# Patient Record
Sex: Female | Born: 1990
Health system: Southern US, Community
[De-identification: ages and names within clinical notes are randomized; demographics above are authoritative.]

## PROBLEM LIST (undated history)

## (undated) DIAGNOSIS — K219 Gastro-esophageal reflux disease without esophagitis: Secondary | ICD-10-CM

## (undated) DIAGNOSIS — L309 Dermatitis, unspecified: Secondary | ICD-10-CM

## (undated) DIAGNOSIS — J45909 Unspecified asthma, uncomplicated: Secondary | ICD-10-CM

## (undated) DIAGNOSIS — T7840XA Allergy, unspecified, initial encounter: Secondary | ICD-10-CM

## (undated) DIAGNOSIS — F419 Anxiety disorder, unspecified: Secondary | ICD-10-CM

## (undated) DIAGNOSIS — F32A Depression, unspecified: Secondary | ICD-10-CM

## (undated) DIAGNOSIS — M1990 Unspecified osteoarthritis, unspecified site: Secondary | ICD-10-CM

## (undated) HISTORY — DX: Anxiety disorder, unspecified: F41.9

## (undated) HISTORY — DX: Allergy, unspecified, initial encounter: T78.40XA

## (undated) HISTORY — DX: Gastro-esophageal reflux disease without esophagitis: K21.9

## (undated) HISTORY — DX: Dermatitis, unspecified: L30.9

## (undated) HISTORY — DX: Unspecified osteoarthritis, unspecified site: M19.90

## (undated) HISTORY — DX: Unspecified asthma, uncomplicated: J45.909

## (undated) HISTORY — DX: Depression, unspecified: F32.A

---

## 2018-01-16 DIAGNOSIS — F419 Anxiety disorder, unspecified: Secondary | ICD-10-CM | POA: Diagnosis not present

## 2018-01-30 DIAGNOSIS — F419 Anxiety disorder, unspecified: Secondary | ICD-10-CM | POA: Diagnosis not present

## 2018-02-06 DIAGNOSIS — F419 Anxiety disorder, unspecified: Secondary | ICD-10-CM | POA: Diagnosis not present

## 2018-02-13 DIAGNOSIS — F419 Anxiety disorder, unspecified: Secondary | ICD-10-CM | POA: Diagnosis not present

## 2018-02-20 DIAGNOSIS — F419 Anxiety disorder, unspecified: Secondary | ICD-10-CM | POA: Diagnosis not present

## 2018-02-27 DIAGNOSIS — F419 Anxiety disorder, unspecified: Secondary | ICD-10-CM | POA: Diagnosis not present

## 2018-03-20 DIAGNOSIS — F419 Anxiety disorder, unspecified: Secondary | ICD-10-CM | POA: Diagnosis not present

## 2018-04-10 DIAGNOSIS — F419 Anxiety disorder, unspecified: Secondary | ICD-10-CM | POA: Diagnosis not present

## 2018-04-24 DIAGNOSIS — F419 Anxiety disorder, unspecified: Secondary | ICD-10-CM | POA: Diagnosis not present

## 2018-05-15 DIAGNOSIS — Z01419 Encounter for gynecological examination (general) (routine) without abnormal findings: Secondary | ICD-10-CM | POA: Diagnosis not present

## 2018-05-15 DIAGNOSIS — F419 Anxiety disorder, unspecified: Secondary | ICD-10-CM | POA: Diagnosis not present

## 2018-05-15 DIAGNOSIS — Z681 Body mass index (BMI) 19 or less, adult: Secondary | ICD-10-CM | POA: Diagnosis not present

## 2018-05-29 DIAGNOSIS — F419 Anxiety disorder, unspecified: Secondary | ICD-10-CM | POA: Diagnosis not present

## 2018-11-09 DIAGNOSIS — L700 Acne vulgaris: Secondary | ICD-10-CM | POA: Diagnosis not present

## 2018-11-09 DIAGNOSIS — D225 Melanocytic nevi of trunk: Secondary | ICD-10-CM | POA: Diagnosis not present

## 2019-10-18 ENCOUNTER — Encounter: Payer: Self-pay | Admitting: Adult Health

## 2019-10-18 ENCOUNTER — Other Ambulatory Visit: Payer: Self-pay

## 2019-10-18 ENCOUNTER — Ambulatory Visit (INDEPENDENT_AMBULATORY_CARE_PROVIDER_SITE_OTHER): Payer: BC Managed Care – PPO | Admitting: Adult Health

## 2019-10-18 VITALS — BP 122/70 | HR 71 | Temp 97.1°F | Resp 16 | Ht 67.5 in | Wt 135.0 lb

## 2019-10-18 DIAGNOSIS — Z1329 Encounter for screening for other suspected endocrine disorder: Secondary | ICD-10-CM

## 2019-10-18 DIAGNOSIS — M2550 Pain in unspecified joint: Secondary | ICD-10-CM | POA: Insufficient documentation

## 2019-10-18 DIAGNOSIS — Z13220 Encounter for screening for lipoid disorders: Secondary | ICD-10-CM | POA: Diagnosis not present

## 2019-10-18 DIAGNOSIS — R5383 Other fatigue: Secondary | ICD-10-CM | POA: Insufficient documentation

## 2019-10-18 DIAGNOSIS — Z0000 Encounter for general adult medical examination without abnormal findings: Secondary | ICD-10-CM

## 2019-10-18 DIAGNOSIS — M2540 Effusion, unspecified joint: Secondary | ICD-10-CM

## 2019-10-18 DIAGNOSIS — R768 Other specified abnormal immunological findings in serum: Secondary | ICD-10-CM

## 2019-10-18 DIAGNOSIS — E559 Vitamin D deficiency, unspecified: Secondary | ICD-10-CM

## 2019-10-18 DIAGNOSIS — Z3009 Encounter for other general counseling and advice on contraception: Secondary | ICD-10-CM | POA: Insufficient documentation

## 2019-10-18 NOTE — Patient Instructions (Addendum)
Breast Self-Awareness Breast self-awareness is knowing how your breasts look and feel. Doing breast self-awareness is important. It allows you to catch a breast problem early while it is still small and can be treated. All women should do breast self-awareness, including women who have had breast implants. Tell your doctor if you notice a change in your breasts. What you need:  A mirror.  A well-lit room. How to do a breast self-exam A breast self-exam is one way to learn what is normal for your breasts and to check for changes. To do a breast self-exam: Look for changes  1. Take off all the clothes above your waist. 2. Stand in front of a mirror in a room with good lighting. 3. Put your hands on your hips. 4. Push your hands down. 5. Look at your breasts and nipples in the mirror to see if one breast or nipple looks different from the other. Check to see if: ? The shape of one breast is different. ? The size of one breast is different. ? There are wrinkles, dips, and bumps in one breast and not the other. 6. Look at each breast for changes in the skin, such as: ? Redness. ? Scaly areas. 7. Look for changes in your nipples, such as: ? Liquid around the nipples. ? Bleeding. ? Dimpling. ? Redness. ? A change in where the nipples are. Feel for changes  1. Lie on your back on the floor. 2. Feel each breast. To do this, follow these steps: ? Pick a breast to feel. ? Put the arm closest to that breast above your head. ? Use your other arm to feel the nipple area of your breast. Feel the area with the pads of your three middle fingers by making small circles with your fingers. For the first circle, press lightly. For the second circle, press harder. For the third circle, press even harder. ? Keep making circles with your fingers at the different pressures as you move down your breast. Stop when you feel your ribs. ? Move your fingers a little toward the center of your body. ? Start  making circles with your fingers again, this time going up until you reach your collarbone. ? Keep making up-and-down circles until you reach your armpit. Remember to keep using the three pressures. ? Feel the other breast in the same way. 3. Sit or stand in the tub or shower. 4. With soapy water on your skin, feel each breast the same way you did in step 2 when you were lying on the floor. Write down what you find Writing down what you find can help you remember what to tell your doctor. Write down:  What is normal for each breast.  Any changes you find in each breast, including: ? The kind of changes you find. ? Whether you have pain. ? Size and location of any lumps.  When you last had your menstrual period. General tips  Check your breasts every month.  If you are breastfeeding, the best time to check your breasts is after you feed your baby or after you use a breast pump.  If you get menstrual periods, the best time to check your breasts is 5-7 days after your menstrual period is over.  With time, you will become comfortable with the self-exam, and you will begin to know if there are changes in your breasts. Contact a doctor if you:  See a change in the shape or size of your breasts or  nipples.  See a change in the skin of your breast or nipples, such as red or scaly skin.  Have fluid coming from your nipples that is not normal.  Find a lump or thick area that was not there before.  Have pain in your breasts.  Have any concerns about your breast health. Summary  Breast self-awareness includes looking for changes in your breasts, as well as feeling for changes within your breasts.  Breast self-awareness should be done in front of a mirror in a well-lit room.  You should check your breasts every month. If you get menstrual periods, the best time to check your breasts is 5-7 days after your menstrual period is over.  Let your doctor know of any changes you see in your  breasts, including changes in size, changes on the skin, pain or tenderness, or fluid from your nipples that is not normal. This information is not intended to replace advice given to you by your health care provider. Make sure you discuss any questions you have with your health care provider. Document Revised: 04/07/2018 Document Reviewed: 04/07/2018 Elsevier Patient Education  [place] Maintenance, Female Adopting a healthy lifestyle and getting preventive care are important in promoting health and wellness. Ask your health care provider about:  The right schedule for you to have regular tests and exams.  Things you can do on your own to prevent diseases and keep yourself healthy. What should I know about diet, weight, and exercise? Eat a healthy diet   Eat a diet that includes plenty of vegetables, fruits, low-fat dairy products, and lean protein.  Do not eat a lot of foods that are high in solid fats, added sugars, or sodium. Maintain a healthy weight Body mass index (BMI) is used to identify weight problems. It estimates body fat based on height and weight. Your health care provider can help determine your BMI and help you achieve or maintain a healthy weight. Get regular exercise Get regular exercise. This is one of the most important things you can do for your health. Most adults should:  Exercise for at least 150 minutes each week. The exercise should increase your heart rate and make you sweat (moderate-intensity exercise).  Do strengthening exercises at least twice a week. This is in addition to the moderate-intensity exercise.  Spend less time sitting. Even light physical activity can be beneficial. Watch cholesterol and blood lipids Have your blood tested for lipids and cholesterol at 28 years of age, then have this test every 5 years. Have your cholesterol levels checked more often if:  Your lipid or cholesterol levels are high.  You are older than 28  years of age.  You are at high risk for heart disease. What should I know about cancer screening? Depending on your health history and family history, you may need to have cancer screening at various ages. This may include screening for:  Breast cancer.  Cervical cancer.  Colorectal cancer.  Skin cancer.  Lung cancer. What should I know about heart disease, diabetes, and high blood pressure? Blood pressure and heart disease  High blood pressure causes heart disease and increases the risk of stroke. This is more likely to develop in people who have high blood pressure readings, are of African descent, or are overweight.  Have your blood pressure checked: ? Every 3-5 years if you are 90-51 years of age. ? Every year if you are 73 years old or older. Diabetes Have regular diabetes screenings. This  checks your fasting blood sugar level. Have the screening done:  Once every three years after age 47 if you are at a normal weight and have a low risk for diabetes.  More often and at a younger age if you are overweight or have a high risk for diabetes. What should I know about preventing infection? Hepatitis B If you have a higher risk for hepatitis B, you should be screened for this virus. Talk with your health care provider to find out if you are at risk for hepatitis B infection. Hepatitis C Testing is recommended for:  Everyone born from [birth year] through 1964.  Anyone with known risk factors for hepatitis C. Sexually transmitted infections (STIs)  Get screened for STIs, including gonorrhea and chlamydia, if: ? You are sexually active and are younger than 28 years of age. ? You are older than 28 years of age and your health care provider tells you that you are at risk for this type of infection. ? Your sexual activity has changed since you were last screened, and you are at increased risk for chlamydia or gonorrhea. Ask your health care provider if you are at risk.  Ask your health  care provider about whether you are at high risk for HIV. Your health care provider may recommend a prescription medicine to help prevent HIV infection. If you choose to take medicine to prevent HIV, you should first get tested for HIV. You should then be tested every 3 months for as long as you are taking the medicine. Pregnancy  If you are about to stop having your period (premenopausal) and you may become pregnant, seek counseling before you get pregnant.  Take 400 to 800 micrograms (mcg) of folic acid every day if you become pregnant.  Ask for birth control (contraception) if you want to prevent pregnancy. Osteoporosis and menopause Osteoporosis is a disease in which the bones lose minerals and strength with aging. This can result in bone fractures. If you are 62 years old or older, or if you are at risk for osteoporosis and fractures, ask your health care provider if you should:  Be screened for bone loss.  Take a calcium or vitamin D supplement to lower your risk of fractures.  Be given hormone replacement therapy (HRT) to treat symptoms of menopause. Follow these instructions at home: Lifestyle  Do not use any products that contain nicotine or tobacco, such as cigarettes, e-cigarettes, and chewing tobacco. If you need help quitting, ask your health care provider.  Do not use street drugs.  Do not share needles.  Ask your health care provider for help if you need support or information about quitting drugs. Alcohol use  Do not drink alcohol if: ? Your health care provider tells you not to drink. ? You are pregnant, may be pregnant, or are planning to become pregnant.  If you drink alcohol: ? Limit how much you use to 0-1 drink a day. ? Limit intake if you are breastfeeding.  Be aware of how much alcohol is in your drink. In the [place] one drink equals one 12 oz bottle of beer (355 mL), one 5 oz glass of wine (148 mL), or one 1 oz glass of hard liquor (44 mL). General  instructions  Schedule regular health, dental, and eye exams.  Stay current with your vaccines.  Tell your health care provider if: ? You often feel depressed. ? You have ever been abused or do not feel safe at home. Summary  Adopting a  healthy lifestyle and getting preventive care are important in promoting health and wellness.  Follow your health care provider's instructions about healthy diet, exercising, and getting tested or screened for diseases.  Follow your health care provider's instructions on monitoring your cholesterol and blood pressure. This information is not intended to replace advice given to you by your health care provider. Make sure you discuss any questions you have with your health care provider. Document Revised: 08/12/2018 Document Reviewed: 08/12/2018 Elsevier Patient Education  2020 [employer].

## 2019-10-18 NOTE — Progress Notes (Signed)
Patient: Brandi Clayton, Female    DOB: 1990-10-15, 29 y.o.   MRN: [id number] Visit Date: 10/18/2019  Today's Provider: Marcille Buffy, FNP   Chief Complaint  Patient presents with  . New Patient (Initial Visit)   Subjective:    New Patient Brandi Clayton is a 29 y.o. female who presents today for health maintenance and establish care as a new patient, she is a former patient of [facility]. She feels fairly well, patient states that she would like to address joint pain in her hands and toes that has been present since Dec. 2020. She reports she is actively exercising . She reports she is sleeping fairly well.  Patient repots last pap examination was in 2019 through [facility] and was normal.  She has been taking Tumeric the past 1- 2 weeks with " maybe some improvement" .  She reports she always haas joint pain in her right hand, she lightly scraped her middle finger of her right hand in December 2020, he knuckle was mildly swollen at that time.  Feels like all of her knuckles are swollen in her left hand joint swelling.   She reports she occasionally has joint swelling in bilateral feet.  No other known injury.  Denies any x ray.  She occasionally takes NSAIDs for PMS and she notices decrease in pain and swelling.  She stopped  Painting her nails thinking this was it.   Maternal grandmother and paternal grandmother with rheumatoid arthritis.   Patient  denies any fever, chills, rash, chest pain, shortness of breath, nausea, vomiting, or diarrhea.  Records request obtained.   She denies any tick bites.   Patient's last menstrual period was 10/12/2019 (exact date).  -----------------------------------------------------------------   Review of Systems  Constitutional: Positive for fatigue.  HENT: Positive for postnasal drip and rhinorrhea.   Gastrointestinal: Positive for abdominal distention, abdominal pain and diarrhea.  Musculoskeletal: Positive for  arthralgias, back pain, joint swelling and myalgias.  Allergic/Immunologic: Positive for environmental allergies.  Psychiatric/Behavioral: Positive for agitation, decreased concentration and sleep disturbance. The patient is nervous/anxious.   All other systems reviewed and are negative.   Social History She   Social History   Socioeconomic History  . Marital status: Married    Spouse name: Not on file  . Number of children: Not on file  . Years of education: Not on file  . Highest education level: Not on file  Occupational History  . Not on file  Tobacco Use  . Smoking status: Not on file  Substance and Sexual Activity  . Alcohol use: Not on file  . Drug use: Not on file  . Sexual activity: Not on file  Other Topics Concern  . Not on file  Social History Narrative  . Not on file   Social Determinants of Health   Financial Resource Strain:   . Difficulty of Paying Living Expenses: Not on file  Food Insecurity:   . Worried About Charity fundraiser in the Last Year: Not on file  . Ran Out of Food in the Last Year: Not on file  Transportation Needs:   . Lack of Transportation (Medical): Not on file  . Lack of Transportation (Non-Medical): Not on file  Physical Activity:   . Days of Exercise per Week: Not on file  . Minutes of Exercise per Session: Not on file  Stress:   . Feeling of Stress : Not on file  Social Connections:   . Frequency  of Communication with Friends and Family: Not on file  . Frequency of Social Gatherings with Friends and Family: Not on file  . Attends Religious Services: Not on file  . Active Member of Clubs or Organizations: Not on file  . Attends Archivist Meetings: Not on file  . Marital Status: Not on file     Family History  No family status information on file.   Her family history is not on file.     Not on File  Previous Medications   No medications on file    Patient Care Team: Doreen Beam, FNP as PCP -  General (Family Medicine)      Objective:  Blood pressure 122/70, pulse 71, temperature (!) 97.1 F (36.2 C), temperature source Oral, resp. rate 16, height 5' 7.5" (1.715 m), weight 135 lb (61.2 kg), last menstrual period 10/12/2019, SpO2 99 %.   Physical Exam Vitals reviewed.  Constitutional:      General: She is not in acute distress.    Appearance: She is well-developed. She is not diaphoretic.     Interventions: She is not intubated. HENT:     Head: Normocephalic and atraumatic.     Right Ear: External ear normal.     Left Ear: External ear normal.     Nose: Nose normal.     Mouth/Throat:     Pharynx: No oropharyngeal exudate.  Eyes:     General: Lids are normal. No scleral icterus.       Right eye: No discharge.        Left eye: No discharge.     Conjunctiva/sclera: Conjunctivae normal.     Right eye: Right conjunctiva is not injected. No exudate or hemorrhage.    Left eye: Left conjunctiva is not injected. No exudate or hemorrhage.    Pupils: Pupils are equal, round, and reactive to light.  Neck:     Thyroid: No thyroid mass or thyromegaly.     Vascular: Normal carotid pulses. No carotid bruit, hepatojugular reflux or JVD.     Trachea: Trachea and phonation normal. No tracheal tenderness or tracheal deviation.     Meningeal: Brudzinski's sign and Kernig's sign absent.  Cardiovascular:     Rate and Rhythm: Normal rate and regular rhythm.     Pulses: Normal pulses.          Radial pulses are 2+ on the right side and 2+ on the left side.       Dorsalis pedis pulses are 2+ on the right side and 2+ on the left side.       Posterior tibial pulses are 2+ on the right side and 2+ on the left side.     Heart sounds: Normal heart sounds, S1 normal and S2 normal. Heart sounds not distant. No murmur. No friction rub. No gallop.   Pulmonary:     Effort: Pulmonary effort is normal. No tachypnea, bradypnea, accessory muscle usage or respiratory distress. She is not intubated.      Breath sounds: Normal breath sounds. No stridor. No wheezing or rales.  Chest:     Chest wall: No tenderness.  Abdominal:     General: Bowel sounds are normal. There is no distension or abdominal bruit.     Palpations: Abdomen is soft. There is no shifting dullness, fluid wave, hepatomegaly, splenomegaly, mass or pulsatile mass.     Tenderness: There is no abdominal tenderness. There is no guarding or rebound.     Hernia: No hernia  is present.  Genitourinary:    Comments: deferred - she will send records of last PAP smear  Musculoskeletal:        General: No deformity.     Right upper arm: Normal.     Left upper arm: Normal.     Right elbow: Normal.     Left elbow: Normal.     Right forearm: Normal.     Left forearm: Normal.     Right wrist: Normal.     Left wrist: Normal.     Right hand: Swelling (scant joiny swelling of middle finger at distal and proximal interphylangeal joint also noted with mild tenderness ring finger of same hand and same joints as middle finger. No rash . No erythema. ), tenderness and bony tenderness present. No deformity or lacerations. Normal range of motion. Normal strength. Normal sensation. There is no disruption of two-point discrimination. Normal capillary refill. Normal pulse.     Left hand: No swelling, deformity, lacerations, tenderness or bony tenderness. Normal range of motion. Normal strength. Normal sensation. There is no disruption of two-point discrimination. Normal capillary refill. Normal pulse.     Cervical back: Full passive range of motion without pain, normal range of motion and neck supple. No edema, erythema or rigidity. No spinous process tenderness or muscular tenderness. Normal range of motion.     Right hip: Normal.     Left hip: Normal.     Right upper leg: Normal.     Left upper leg: Normal.     Right knee: Normal.     Left knee: Normal.     Right lower leg: Normal.     Left lower leg: Normal.     Right ankle: Normal.     Left  ankle: Normal.  Lymphadenopathy:     Head:     Right side of head: No submental, submandibular, tonsillar, preauricular, posterior auricular or occipital adenopathy.     Left side of head: No submental, submandibular, tonsillar, preauricular, posterior auricular or occipital adenopathy.     Cervical: No cervical adenopathy.     Right cervical: No superficial, deep or posterior cervical adenopathy.    Left cervical: No superficial, deep or posterior cervical adenopathy.     Upper Body:     Right upper body: No supraclavicular or pectoral adenopathy.     Left upper body: No supraclavicular or pectoral adenopathy.  Skin:    General: Skin is warm and dry.     Capillary Refill: Capillary refill takes less than 2 seconds.     Coloration: Skin is not pale.     Findings: No abrasion, bruising, burn, ecchymosis, erythema, lesion, petechiae or rash.     Nails: There is no clubbing.     Comments: Nails have some yellow discoloration of all nails bilaterally hands towards distal nail, she has new growth that appears normal- she reports this was from nail stain.   Neurological:     Mental Status: She is alert and oriented to person, place, and time.     GCS: GCS eye subscore is 4. GCS verbal subscore is 5. GCS motor subscore is 6.     Cranial Nerves: No cranial nerve deficit.     Sensory: No sensory deficit.     Motor: No weakness, tremor, atrophy, abnormal muscle tone or seizure activity.     Coordination: Coordination normal.     Gait: Gait normal.     Deep Tendon Reflexes: Reflexes are normal and symmetric. Reflexes normal. Babinski sign  absent on the right side. Babinski sign absent on the left side.     Reflex Scores:      Tricep reflexes are 2+ on the right side and 2+ on the left side.      Bicep reflexes are 2+ on the right side and 2+ on the left side.      Brachioradialis reflexes are 2+ on the right side and 2+ on the left side.      Patellar reflexes are 2+ on the right side and 2+ on  the left side.      Achilles reflexes are 2+ on the right side and 2+ on the left side. Psychiatric:        Mood and Affect: Mood normal.        Speech: Speech normal.        Behavior: Behavior normal.        Thought Content: Thought content normal.        Judgment: Judgment normal.      Depression Screen Depression screen [facility] 2/9 10/18/2019  Decreased Interest 0  Down, Depressed, Hopeless 0  PHQ - 2 Score 0  Altered sleeping 1  Tired, decreased energy 1  Change in appetite 1  Feeling bad or failure about yourself  0  Trouble concentrating 1  Moving slowly or fidgety/restless 0  Suicidal thoughts 0  PHQ-9 Score 4  Difficult doing work/chores Not difficult at all      Assessment & Plan:     Routine Health Maintenance and Physical Exam  Exercise Activities and Dietary recommendations Goals   None      There is no immunization history on file for this patient.  Health Maintenance  Topic Date Due  . HIV Screening  01/27/2007  . TETANUS/TDAP  01/27/2011  . PAP-Cervical Cytology Screening  01/26/2013  . PAP SMEAR-Modifier  01/26/2013  . INFLUENZA VACCINE  04/03/2019     Discussed health benefits of physical activity, and encouraged her to engage in regular exercise appropriate for her age and condition.   yearly dental and eye exams recommended.  She needs PAP records sent, believes  she is due. Will schedule here or let us know if wants gynecology.     1. Routine health maintenance Healthy lifestyle, diet.  - POCT urinalysis dipstick  2. Joint swelling Will follow closely, x  Ray today.   - CBC with Differential/Platelet - Comprehensive Metabolic Panel (CMET) - TSH - Rheumatoid factor - ANA Direct w/Reflex if Positive - DG Hand Complete Right; Future - B. burgdorfi antibodies - RPR w/reflex to TrepSure - Uric acid  3. Fatigue, unspecified type Rule our anemia.Healthy diet and lifestyle recommendations  - Rheumatoid factor - B. burgdorfi  antibodies - RPR w/reflex to TrepSure  4. Screening for lipoid disorders Healthy diet and lifestyle.  - Lipid Panel w/o Chol/HDL Ratio  5. Screening for thyroid disorder Screening  - TSH  6. Vitamin D deficiency Will check level.  - VITAMIN D 25 Hydroxy (Vit-D Deficiency, Fractures)  7. Arthralgia, unspecified joint Right hand left  Than right, has mild knee's bilaterally with pain at times since highschool- she was a runner.  - Rheumatoid factor - ANA Direct w/Reflex if Positive - DG Hand Complete Right; Future - B. burgdorfi antibodies Follow up on joint pain.   Return in about 1 month (around 11/15/2019), or if symptoms worsen or fail to improve, for at any time for any worsening symptoms, Go to Emergency room/ urgent care if worse.  Advised  patient call the office or your primary care doctor for an appointment if no improvement within 72 hours or if any symptoms change or worsen at any time  Advised ER or urgent Care if after hours or on weekend. Call 911 for emergency symptoms at any time.Patinet verbalized understanding of all instructions given/reviewed and treatment plan and has no further questions or concerns at this time.    The entirety of the information documented in the History of Present Illness, Review of Systems and Physical Exam were personally obtained by me. Portions of this information were initially documented by the  Certified Medical Assistant whose name is documented in [place] and reviewed by me for thoroughness and accuracy.  I have personally performed the exam and reviewed the chart and it is accurate to the best of my knowledge.  Haematologist has been used and any errors in dictation or transcription are unintentional.  Kelby Aline. [facility]  --------------------------------------------------------------------

## 2019-10-19 ENCOUNTER — Ambulatory Visit
Admission: RE | Admit: 2019-10-19 | Discharge: 2019-10-19 | Disposition: A | Discharge status: Home / Self Care | Payer: BC Managed Care – PPO | Attending: Adult Health | Admitting: Adult Health

## 2019-10-19 ENCOUNTER — Other Ambulatory Visit: Payer: Self-pay

## 2019-10-19 ENCOUNTER — Ambulatory Visit
Admission: RE | Admit: 2019-10-19 | Discharge: 2019-10-19 | Disposition: A | Discharge status: Home / Self Care | Payer: BC Managed Care – PPO | Source: Ambulatory Visit | Attending: Adult Health | Admitting: Adult Health

## 2019-10-19 ENCOUNTER — Other Ambulatory Visit: Payer: Self-pay | Admitting: Adult Health

## 2019-10-19 DIAGNOSIS — E559 Vitamin D deficiency, unspecified: Secondary | ICD-10-CM | POA: Diagnosis not present

## 2019-10-19 DIAGNOSIS — R5383 Other fatigue: Secondary | ICD-10-CM | POA: Diagnosis not present

## 2019-10-19 DIAGNOSIS — M2540 Effusion, unspecified joint: Secondary | ICD-10-CM | POA: Insufficient documentation

## 2019-10-19 DIAGNOSIS — M79641 Pain in right hand: Secondary | ICD-10-CM | POA: Diagnosis not present

## 2019-10-19 DIAGNOSIS — M7989 Other specified soft tissue disorders: Secondary | ICD-10-CM | POA: Diagnosis not present

## 2019-10-19 DIAGNOSIS — Z13220 Encounter for screening for lipoid disorders: Secondary | ICD-10-CM | POA: Diagnosis not present

## 2019-10-19 DIAGNOSIS — M2550 Pain in unspecified joint: Secondary | ICD-10-CM | POA: Insufficient documentation

## 2019-10-19 DIAGNOSIS — Z1329 Encounter for screening for other suspected endocrine disorder: Secondary | ICD-10-CM | POA: Diagnosis not present

## 2019-10-19 NOTE — Progress Notes (Signed)
X ray is normal of right hand. Will await labs. Keep follow up as discussed at office visit.

## 2019-10-21 LAB — COMPREHENSIVE METABOLIC PANEL
ALT: 17 IU/L (ref 0–32)
AST: 24 IU/L (ref 0–40)
Albumin/Globulin Ratio: 1.8 (ref 1.2–2.2)
Albumin: 4.6 g/dL (ref 3.9–5.0)
Alkaline Phosphatase: 61 IU/L (ref 39–117)
BUN/Creatinine Ratio: 19 (ref 9–23)
BUN: 14 mg/dL (ref 6–20)
Bilirubin Total: 0.3 mg/dL (ref 0.0–1.2)
CO2: 22 mmol/L (ref 20–29)
Calcium: 9.5 mg/dL (ref 8.7–10.2)
Chloride: 101 mmol/L (ref 96–106)
Creatinine, Ser: 0.72 mg/dL (ref 0.57–1.00)
GFR calc Af Amer: 133 mL/min/{1.73_m2} (ref >59)
GFR calc non Af Amer: 115 mL/min/{1.73_m2} (ref >59)
Globulin, Total: 2.6 g/dL (ref 1.5–4.5)
Glucose: 83 mg/dL (ref 65–99)
Potassium: 4.1 mmol/L (ref 3.5–5.2)
Sodium: 138 mmol/L (ref 134–144)
Total Protein: 7.2 g/dL (ref 6.0–8.5)

## 2019-10-21 LAB — LIPID PANEL W/O CHOL/HDL RATIO
Cholesterol, Total: 177 mg/dL (ref 100–199)
HDL: 61 mg/dL (ref >39)
LDL Chol Calc (NIH): 105 mg/dL — ABNORMAL HIGH (ref 0–99)
Triglycerides: 54 mg/dL (ref 0–149)
VLDL Cholesterol Cal: 11 mg/dL (ref 5–40)

## 2019-10-21 LAB — CBC WITH DIFFERENTIAL/PLATELET
Basophils Absolute: 0 10*3/uL (ref 0.0–0.2)
Basos: 1 %
EOS (ABSOLUTE): 0.1 10*3/uL (ref 0.0–0.4)
Eos: 2 %
Hematocrit: 40.1 % (ref 34.0–46.6)
Hemoglobin: 13.3 g/dL (ref 11.1–15.9)
Immature Grans (Abs): 0 10*3/uL (ref 0.0–0.1)
Immature Granulocytes: 0 %
Lymphocytes Absolute: 2.3 10*3/uL (ref 0.7–3.1)
Lymphs: 45 %
MCH: 29.8 pg (ref 26.6–33.0)
MCHC: 33.2 g/dL (ref 31.5–35.7)
MCV: 90 fL (ref 79–97)
Monocytes Absolute: 0.8 10*3/uL (ref 0.1–0.9)
Monocytes: 15 %
Neutrophils Absolute: 1.9 10*3/uL (ref 1.4–7.0)
Neutrophils: 37 %
Platelets: 288 10*3/uL (ref 150–450)
RBC: 4.47 x10E6/uL (ref 3.77–5.28)
RDW: 11.4 % — ABNORMAL LOW (ref 11.7–15.4)
WBC: 5.1 10*3/uL (ref 3.4–10.8)

## 2019-10-21 LAB — URIC ACID: Uric Acid: 3.2 mg/dL (ref 2.6–6.2)

## 2019-10-21 LAB — B. BURGDORFI ANTIBODIES: Lyme IgG/IgM Ab: <0.91 {ISR} (ref 0.00–0.90)

## 2019-10-21 LAB — RHEUMATOID FACTOR: Rheumatoid fact SerPl-aCnc: 32.4 IU/mL — ABNORMAL HIGH (ref 0.0–13.9)

## 2019-10-21 LAB — VITAMIN D 25 HYDROXY (VIT D DEFICIENCY, FRACTURES): Vit D, 25-Hydroxy: 28.1 ng/mL — ABNORMAL LOW (ref 30.0–100.0)

## 2019-10-21 LAB — TSH: TSH: 2.71 u[IU]/mL (ref 0.450–4.500)

## 2019-10-21 LAB — ANA W/REFLEX IF POSITIVE: Anti Nuclear Antibody (ANA): NEGATIVE

## 2019-10-21 LAB — T PALLIDUM ANTIBODY, EIA: T pallidum Antibody, EIA: NEGATIVE

## 2019-10-21 LAB — ABO

## 2019-10-21 LAB — RPR W/REFLEX TO TREPSURE: RPR: NONREACTIVE

## 2019-10-22 NOTE — Progress Notes (Signed)
CBC ok no anemia, or infection.  CMP normal liver, kidney function and electrolytes. LDL bad cholesterol just mild elevation, recommend healthy low cholesterol heart healthy diet and increased exercise. TSH lab for thyroid ok.  RPR negative. Lyme disease antibody negative. ANA is negative. Uric acid within normal. Vitamin D low, will supplement with prescription Vitamin D at fifty thousand international units take ONE tablet ONCE weekly for 8 weeks, then start over the counter Vitamin D 3 at four thousand international units once daily and have Vitamin D level rechecked by lab in 3 months.  Blood type by ABO grouping is O.  No RH was performed on blood.  RA level is elevated, would like her to be evaluated for possible Rheumatoid disease given her symptoms. I do have some concern for possibl;e unrelated Raynaud's as well given the presentation of her hands. Hand x ray was normal. Will refer to Dr. Meda Coffee rheumatologist in [place] unless she has another preference. Please let me know.

## 2019-10-25 ENCOUNTER — Encounter: Payer: Self-pay | Admitting: Adult Health

## 2019-10-26 NOTE — Progress Notes (Signed)
Orders Placed This Encounter  Procedures  . DG Hand Complete Right    Standing Status:   Future    Number of Occurrences:   1    Standing Expiration Date:   11/15/2019    Order Specific Question:   Reason for Exam (SYMPTOM  OR DIAGNOSIS REQUIRED)    Answer:   right hand x ray - due joint swelling and pain since december - distal more painful    Order Specific Question:   Is patient pregnant?    Answer:   No    Order Specific Question:   Preferred imaging location?    Answer:   [facility]    Order Specific Question:   Radiology Contrast Protocol - do NOT remove file path    Answer:   [email or web address]  . CBC with Differential/Platelet  . Comprehensive Metabolic Panel (CMET)  . TSH  . Rheumatoid factor  . ANA Direct w/Reflex if Positive  . Lipid Panel w/o Chol/HDL Ratio  . B. burgdorfi antibodies  . RPR w/reflex to TrepSure  . Uric acid  . VITAMIN D 25 Hydroxy (Vit-D Deficiency, Fractures)  . ABO  . Ambulatory referral to Rheumatology    Referral Priority:   Routine    Referral Type:   Consultation    Referral Reason:   Specialty Services Required    Referred to Provider:   Marlowe Sax, MD    Requested Specialty:   Rheumatology    Number of Visits Requested:   1

## 2019-10-26 NOTE — Addendum Note (Signed)
Addended by: Doreen Beam on: 10/26/2019 10:14 AM   Modules accepted: Orders

## 2019-11-02 DIAGNOSIS — F4322 Adjustment disorder with anxiety: Secondary | ICD-10-CM | POA: Diagnosis not present

## 2019-11-11 ENCOUNTER — Encounter: Payer: Self-pay | Admitting: Adult Health

## 2019-11-11 DIAGNOSIS — E559 Vitamin D deficiency, unspecified: Secondary | ICD-10-CM | POA: Diagnosis not present

## 2019-11-11 DIAGNOSIS — M1990 Unspecified osteoarthritis, unspecified site: Secondary | ICD-10-CM | POA: Insufficient documentation

## 2019-11-11 DIAGNOSIS — M06842 Other specified rheumatoid arthritis, left hand: Secondary | ICD-10-CM | POA: Diagnosis not present

## 2019-11-11 DIAGNOSIS — I7300 Raynaud's syndrome without gangrene: Secondary | ICD-10-CM | POA: Diagnosis not present

## 2019-11-15 ENCOUNTER — Ambulatory Visit (INDEPENDENT_AMBULATORY_CARE_PROVIDER_SITE_OTHER): Payer: BC Managed Care – PPO | Admitting: Adult Health

## 2019-11-15 ENCOUNTER — Encounter: Payer: Self-pay | Admitting: Adult Health

## 2019-11-15 ENCOUNTER — Other Ambulatory Visit: Payer: Self-pay

## 2019-11-15 VITALS — BP 110/82 | HR 74 | Temp 97.9°F | Resp 15 | Wt 136.8 lb

## 2019-11-15 DIAGNOSIS — Z8261 Family history of arthritis: Secondary | ICD-10-CM

## 2019-11-15 DIAGNOSIS — I7300 Raynaud's syndrome without gangrene: Secondary | ICD-10-CM | POA: Diagnosis not present

## 2019-11-15 DIAGNOSIS — M1990 Unspecified osteoarthritis, unspecified site: Secondary | ICD-10-CM

## 2019-11-15 DIAGNOSIS — M2540 Effusion, unspecified joint: Secondary | ICD-10-CM | POA: Diagnosis not present

## 2019-11-15 DIAGNOSIS — E559 Vitamin D deficiency, unspecified: Secondary | ICD-10-CM

## 2019-11-15 DIAGNOSIS — R768 Other specified abnormal immunological findings in serum: Secondary | ICD-10-CM

## 2019-11-15 NOTE — Progress Notes (Signed)
Patient: Brandi Clayton Female    DOB: [birth year]   29 y.o.   MRN: [id number] Visit Date: 11/15/2019  Today's Provider: Marcille Buffy, FNP   Chief Complaint  Patient presents with  . Joint Pain    Follow Up   Subjective:     HPI  Follow up for Arthralgia, unpecified joint  The patient was last seen for this 1 months ago. Changes made at last visit include none, labs were ordered along with xray of right hand.Pateint was referred to Dr. Meda Coffee rhematology to r/o Rheumatoid disease and Raynauds. Patient reports that she saw Dr. Meda Coffee who ran additional labs and xray of left hand. Patient reports that labs and xray were normal, she has a follow up scheduled 11/24/19. She feels that condition is Unchanged. She will keep follow up clinic.   PAP smear 2019 - was normal per patient. [facility].  She will continue to go to [facility] clinic for pap or let us know here if she desires gynecology exam here.   Patient denies any other concerns at this visit.  Patient  denies any fever, chills, rash, chest pain, shortness of breath, nausea, vomiting, or diarrhea.       ------------------------------------------------------------------------------------ [facility] Allergies  Allergen Reactions  . Other Rash    Patient reports the Sun    No current outpatient medications on file.  Review of Systems  Constitutional: Negative.   HENT: Negative.   Respiratory: Negative.   Cardiovascular: Negative.   Gastrointestinal: Negative.   Musculoskeletal: Positive for arthralgias, back pain and joint swelling. Negative for gait problem, myalgias, neck pain and neck stiffness.  Skin: Negative.     Social History   Tobacco Use  . Smoking status: Never Smoker  . Smokeless tobacco: Never Used  Substance Use Topics  . Alcohol use: Yes      Objective:   BP 110/82   Pulse 74   Temp 97.9 F (36.6 C) (Oral)   Resp 15   Wt 136 lb 12.8 oz (62.1 kg)   SpO2 99%   BMI 21.11 kg/m    Vitals:   11/15/19 0857  BP: 110/82  Pulse: 74  Resp: 15  Temp: 97.9 F (36.6 C)  TempSrc: Oral  SpO2: 99%  Weight: 136 lb 12.8 oz (62.1 kg)  Body mass index is 21.11 kg/m.   Physical Exam Vitals reviewed.  Constitutional:      General: She is not in acute distress.    Appearance: She is well-developed. She is not diaphoretic.     Interventions: She is not intubated. HENT:     Head: Normocephalic and atraumatic.     Right Ear: External ear normal.     Left Ear: External ear normal.     Nose: Nose normal.     Mouth/Throat:     Pharynx: No oropharyngeal exudate.  Eyes:     General: Lids are normal. No scleral icterus.       Right eye: No discharge.        Left eye: No discharge.     Conjunctiva/sclera: Conjunctivae normal.     Right eye: Right conjunctiva is not injected. No exudate or hemorrhage.    Left eye: Left conjunctiva is not injected. No exudate or hemorrhage.    Pupils: Pupils are equal, round, and reactive to light.  Neck:     Thyroid: No thyroid mass or thyromegaly.     Vascular: Normal carotid pulses. No carotid bruit, hepatojugular reflux or  JVD.     Trachea: Trachea and phonation normal. No tracheal tenderness or tracheal deviation.     Meningeal: Brudzinski's sign and Kernig's sign absent.  Cardiovascular:     Rate and Rhythm: Normal rate and regular rhythm.     Pulses: Normal pulses.          Radial pulses are 2+ on the right side and 2+ on the left side.       Dorsalis pedis pulses are 2+ on the right side and 2+ on the left side.       Posterior tibial pulses are 2+ on the right side and 2+ on the left side.     Heart sounds: Normal heart sounds, S1 normal and S2 normal. Heart sounds not distant. No murmur. No friction rub. No gallop.   Pulmonary:     Effort: Pulmonary effort is normal. No tachypnea, bradypnea, accessory muscle usage or respiratory distress. She is not intubated.     Breath sounds: Normal breath sounds. No stridor. No wheezing,  rhonchi or rales.  Chest:     Chest wall: No tenderness.  Abdominal:     General: Bowel sounds are normal. There is no distension or abdominal bruit.     Palpations: Abdomen is soft. There is no shifting dullness, fluid wave, hepatomegaly, splenomegaly, mass or pulsatile mass.     Tenderness: There is no abdominal tenderness. There is no right CVA tenderness, left CVA tenderness, guarding or rebound.     Hernia: No hernia is present.  Genitourinary:    Comments: deferred - she will send records of last PAP smear and is followed at [facility] clinic.  Musculoskeletal:        General: No deformity.     Right upper arm: Normal.     Left upper arm: Normal.     Right elbow: Normal.     Left elbow: Normal.     Right forearm: Normal.     Left forearm: Normal.     Right wrist: Normal.     Left wrist: Normal.     Right hand: Swelling (scant joiny swelling of middle finger at distal and proximal interphylangeal joint also noted with mild tenderness ring finger of same hand and same joints as middle finger. No rash . No erythema. ), tenderness and bony tenderness present. No deformity or lacerations. Normal range of motion. Normal strength. Normal sensation. There is no disruption of two-point discrimination. Normal capillary refill. Normal pulse.     Left hand: No swelling, deformity, lacerations, tenderness or bony tenderness. Normal range of motion. Normal strength. Normal sensation. There is no disruption of two-point discrimination. Normal capillary refill. Normal pulse.     Cervical back: Full passive range of motion without pain, normal range of motion and neck supple. No edema, erythema or rigidity. No spinous process tenderness or muscular tenderness. Normal range of motion.     Right hip: Normal.     Left hip: Normal.     Right upper leg: Normal.     Left upper leg: Normal.     Right knee: Normal.     Left knee: Normal.     Right lower leg: Normal.     Left lower leg: Normal.     Right ankle:  Normal.     Left ankle: Normal.  Lymphadenopathy:     Head:     Right side of head: No submental, submandibular, tonsillar, preauricular, posterior auricular or occipital adenopathy.     Left side of head:  No submental, submandibular, tonsillar, preauricular, posterior auricular or occipital adenopathy.     Cervical: No cervical adenopathy.     Right cervical: No superficial, deep or posterior cervical adenopathy.    Left cervical: No superficial, deep or posterior cervical adenopathy.     Upper Body:     Right upper body: No supraclavicular or pectoral adenopathy.     Left upper body: No supraclavicular or pectoral adenopathy.  Skin:    General: Skin is warm and dry.     Capillary Refill: Capillary refill takes less than 2 seconds.     Coloration: Skin is not pale.     Findings: No abrasion, bruising, burn, ecchymosis, erythema, lesion, petechiae or rash.     Nails: There is no clubbing.     Comments: Nails have some yellow discoloration of all nails bilaterally hands towards distal nail, she has new growth that appears normal- she reports this was from nail stain.   Neurological:     Mental Status: She is alert and oriented to person, place, and time.     GCS: GCS eye subscore is 4. GCS verbal subscore is 5. GCS motor subscore is 6.     Cranial Nerves: No cranial nerve deficit.     Sensory: No sensory deficit.     Motor: No weakness, tremor, atrophy, abnormal muscle tone or seizure activity.     Coordination: Coordination normal.     Gait: Gait normal.     Deep Tendon Reflexes: Reflexes are normal and symmetric. Reflexes normal. Babinski sign absent on the right side. Babinski sign absent on the left side.     Reflex Scores:      Tricep reflexes are 2+ on the right side and 2+ on the left side.      Bicep reflexes are 2+ on the right side and 2+ on the left side.      Brachioradialis reflexes are 2+ on the right side and 2+ on the left side.      Patellar reflexes are 2+ on the  right side and 2+ on the left side.      Achilles reflexes are 2+ on the right side and 2+ on the left side. Psychiatric:        Mood and Affect: Mood normal.        Speech: Speech normal.        Behavior: Behavior normal.        Thought Content: Thought content normal.        Judgment: Judgment normal.      No results found for any visits on 11/15/19.     Assessment & Plan       Rheumatoid factor positive  Raynaud's disease without gangrene  Joint swelling   Keep follow up with DR. Meda Coffee. Return as needed and for routine health maintenance per guidelines national as discussed.    Advised patient call the office or your primary care doctor for an appointment if no improvement within 72 hours or if any symptoms change or worsen at any time  Advised ER or urgent Care if after hours or on weekend. Call 911 for emergency symptoms at any time.Patinet verbalized understanding of all instructions given/reviewed and treatment plan and has no further questions or concerns at this time.     The entirety of the information documented in the History of Present Illness, Review of Systems and Physical Exam were personally obtained by me. Portions of this information were initially documented by the  Certified  Medical Assistant whose name is documented in [place] and reviewed by me for thoroughness and accuracy.  I have personally performed the exam and reviewed the chart and it is accurate to the best of my knowledge.  Haematologist has been used and any errors in dictation or transcription are unintentional.  Kelby Aline. [place], [facility]

## 2019-11-16 DIAGNOSIS — M059 Rheumatoid arthritis with rheumatoid factor, unspecified: Secondary | ICD-10-CM | POA: Diagnosis not present

## 2019-11-16 DIAGNOSIS — F4322 Adjustment disorder with anxiety: Secondary | ICD-10-CM | POA: Diagnosis not present

## 2019-11-16 DIAGNOSIS — I7300 Raynaud's syndrome without gangrene: Secondary | ICD-10-CM | POA: Diagnosis not present

## 2019-11-16 DIAGNOSIS — Z79899 Other long term (current) drug therapy: Secondary | ICD-10-CM | POA: Diagnosis not present

## 2019-11-17 ENCOUNTER — Encounter: Payer: Self-pay | Admitting: Adult Health

## 2019-11-17 DIAGNOSIS — R768 Other specified abnormal immunological findings in serum: Secondary | ICD-10-CM | POA: Insufficient documentation

## 2019-11-17 DIAGNOSIS — Z8261 Family history of arthritis: Secondary | ICD-10-CM | POA: Insufficient documentation

## 2019-11-17 NOTE — Patient Instructions (Signed)
Health Maintenance, Female Adopting a healthy lifestyle and getting preventive care are important in promoting health and wellness. Ask your health care provider about:  The right schedule for you to have regular tests and exams.  Things you can do on your own to prevent diseases and keep yourself healthy. What should I know about diet, weight, and exercise? Eat a healthy diet   Eat a diet that includes plenty of vegetables, fruits, low-fat dairy products, and lean protein.  Do not eat a lot of foods that are high in solid fats, added sugars, or sodium. Maintain a healthy weight Body mass index (BMI) is used to identify weight problems. It estimates body fat based on height and weight. Your health care provider can help determine your BMI and help you achieve or maintain a healthy weight. Get regular exercise Get regular exercise. This is one of the most important things you can do for your health. Most adults should:  Exercise for at least 150 minutes each week. The exercise should increase your heart rate and make you sweat (moderate-intensity exercise).  Do strengthening exercises at least twice a week. This is in addition to the moderate-intensity exercise.  Spend less time sitting. Even light physical activity can be beneficial. Watch cholesterol and blood lipids Have your blood tested for lipids and cholesterol at 28 years of age, then have this test every 5 years. Have your cholesterol levels checked more often if:  Your lipid or cholesterol levels are high.  You are older than 28 years of age.  You are at high risk for heart disease. What should I know about cancer screening? Depending on your health history and family history, you may need to have cancer screening at various ages. This may include screening for:  Breast cancer.  Cervical cancer.  Colorectal cancer.  Skin cancer.  Lung cancer. What should I know about heart disease, diabetes, and high blood  pressure? Blood pressure and heart disease  High blood pressure causes heart disease and increases the risk of stroke. This is more likely to develop in people who have high blood pressure readings, are of African descent, or are overweight.  Have your blood pressure checked: ? Every 3-5 years if you are 18-39 years of age. ? Every year if you are 40 years old or older. Diabetes Have regular diabetes screenings. This checks your fasting blood sugar level. Have the screening done:  Once every three years after age 40 if you are at a normal weight and have a low risk for diabetes.  More often and at a younger age if you are overweight or have a high risk for diabetes. What should I know about preventing infection? Hepatitis B If you have a higher risk for hepatitis B, you should be screened for this virus. Talk with your health care provider to find out if you are at risk for hepatitis B infection. Hepatitis C Testing is recommended for:  Everyone born from 1945 through 1965.  Anyone with known risk factors for hepatitis C. Sexually transmitted infections (STIs)  Get screened for STIs, including gonorrhea and chlamydia, if: ? You are sexually active and are younger than 28 years of age. ? You are older than 28 years of age and your health care provider tells you that you are at risk for this type of infection. ? Your sexual activity has changed since you were last screened, and you are at increased risk for chlamydia or gonorrhea. Ask your health care provider if   you are at risk.  Ask your health care provider about whether you are at high risk for HIV. Your health care provider may recommend a prescription medicine to help prevent HIV infection. If you choose to take medicine to prevent HIV, you should first get tested for HIV. You should then be tested every 3 months for as long as you are taking the medicine. Pregnancy  If you are about to stop having your period (premenopausal) and  you may become pregnant, seek counseling before you get pregnant.  Take 400 to 800 micrograms (mcg) of folic acid every day if you become pregnant.  Ask for birth control (contraception) if you want to prevent pregnancy. Osteoporosis and menopause Osteoporosis is a disease in which the bones lose minerals and strength with aging. This can result in bone fractures. If you are 102 years old or older, or if you are at risk for osteoporosis and fractures, ask your health care provider if you should:  Be screened for bone loss.  Take a calcium or vitamin D supplement to lower your risk of fractures.  Be given hormone replacement therapy (HRT) to treat symptoms of menopause. Follow these instructions at home: Lifestyle  Do not use any products that contain nicotine or tobacco, such as cigarettes, e-cigarettes, and chewing tobacco. If you need help quitting, ask your health care provider.  Do not use street drugs.  Do not share needles.  Ask your health care provider for help if you need support or information about quitting drugs. Alcohol use  Do not drink alcohol if: ? Your health care provider tells you not to drink. ? You are pregnant, may be pregnant, or are planning to become pregnant.  If you drink alcohol: ? Limit how much you use to 0-1 drink a day. ? Limit intake if you are breastfeeding.  Be aware of how much alcohol is in your drink. In the [place] one drink equals one 12 oz bottle of beer (355 mL), one 5 oz glass of wine (148 mL), or one 1 oz glass of hard liquor (44 mL). General instructions  Schedule regular health, dental, and eye exams.  Stay current with your vaccines.  Tell your health care provider if: ? You often feel depressed. ? You have ever been abused or do not feel safe at home. Summary  Adopting a healthy lifestyle and getting preventive care are important in promoting health and wellness.  Follow your health care provider's instructions about healthy  diet, exercising, and getting tested or screened for diseases.  Follow your health care provider's instructions on monitoring your cholesterol and blood pressure. This information is not intended to replace advice given to you by your health care provider. Make sure you discuss any questions you have with your health care provider. Document Revised: 08/12/2018 Document Reviewed: 08/12/2018 Elsevier Patient Education  [place] phenomenon is a condition that affects the blood vessels (arteries) that carry blood to your fingers and toes. The arteries that supply blood to your ears, lips, nipples, or the tip of your nose might also be affected. Raynaud phenomenon causes the arteries to become narrow temporarily (spasm). As a result, the flow of blood to the affected areas is temporarily decreased. This usually occurs in response to cold temperatures or stress. During an attack, the skin in the affected areas turns white, then blue, and finally red. You may also feel tingling or numbness in those areas. Attacks usually last for only a brief  period, and then the blood flow to the area returns to normal. In most cases, Raynaud phenomenon does not cause serious health problems. What are the causes? In many cases, the cause of this condition is not known. The condition may occur on its own (primary Raynaud phenomenon) or may be associated with other diseases or factors (secondary Raynaud phenomenon). Possible causes may include:  Diseases or medical conditions that damage the arteries.  Injuries and repetitive actions that hurt the hands or feet.  Being exposed to certain chemicals.  Taking medicines that narrow the arteries.  Other medical conditions, such as lupus, scleroderma, rheumatoid arthritis, thyroid problems, blood disorders, Sjogren syndrome, or atherosclerosis. What increases the risk? The following factors may make you more likely to develop this  condition:  Being 67-28 years old.  Being female.  Having a family history of Raynaud phenomenon.  Living in a cold climate.  Smoking. What are the signs or symptoms? Symptoms of this condition usually occur when you are exposed to cold temperatures or when you have emotional stress. The symptoms may last for a few minutes or up to several hours. They usually affect your fingers but may also affect your toes, nipples, lips, ears, or the tip of your nose. Symptoms may include:  Changes in skin color. The skin in the affected areas will turn pale or white. The skin may then change from white to bluish to red as normal blood flow returns to the area.  Numbness, tingling, or pain in the affected areas. In severe cases, symptoms may include:  Skin sores.  Tissues decaying and dying (gangrene). How is this diagnosed? This condition may be diagnosed based on:  Your symptoms and medical history.  A physical exam. During the exam, you may be asked to put your hands in cold water to check for a reaction to cold temperature.  Tests, such as: ? Blood tests to check for other diseases or conditions. ? A test to check the movement of blood through your arteries and veins (vascular ultrasound). ? A test in which the skin at the base of your fingernail is examined under a microscope (nailfold capillaroscopy). How is this treated? Treatment for this condition often involves making lifestyle changes and taking steps to control your exposure to cold temperatures. For more severe cases, medicine (calcium channel blockers) may be used to improve blood flow. Surgery is sometimes done to block the nerves that control the affected arteries, but this is rare. Follow these instructions at home: Avoiding cold temperatures Take these steps to avoid exposure to cold:  If possible, stay indoors during cold weather.  When you go outside during cold weather, dress in layers and wear mittens, a hat, a scarf,  and warm footwear.  Wear mittens or gloves when handling ice or frozen food.  Use holders for glasses or cans containing cold drinks.  Let warm water run for a while before taking a shower or bath.  Warm up the car before driving in cold weather. Lifestyle   If possible, avoid stressful and emotional situations. Try to find ways to manage your stress, such as: ? Exercise. ? Yoga. ? Meditation. ? Biofeedback.  Do not use any products that contain nicotine or tobacco, such as cigarettes and e-cigarettes. If you need help quitting, ask your health care provider.  Avoid secondhand smoke.  Limit your use of caffeine. ? Switch to decaffeinated coffee, tea, and soda. ? Avoid chocolate.  Avoid vibrating tools and machinery. General instructions  Protect  your hands and feet from injuries, cuts, or bruises.  Avoid wearing tight rings or wristbands.  Wear loose fitting socks and comfortable, roomy shoes.  Take over-the-counter and prescription medicines only as told by your health care provider. Contact a health care provider if:  Your discomfort becomes worse despite lifestyle changes.  You develop sores on your fingers or toes that do not heal.  Your fingers or toes turn black.  You have breaks in the skin on your fingers or toes.  You have a fever.  You have pain or swelling in your joints.  You have a rash.  Your symptoms occur on only one side of your body. Summary  Raynaud phenomenon is a condition that affects the arteries that carry blood to your fingers, toes, ears, lips, nipples, or the tip of your nose.  In many cases, the cause of this condition is not known.  Symptoms of this condition include changes in skin color, and numbness and tingling of the affected area.  Treatment for this condition includes lifestyle changes, reducing exposure to cold temperatures, and using medicines for severe cases of the condition.  Contact your health care provider if  your condition worsens despite treatment. This information is not intended to replace advice given to you by your health care provider. Make sure you discuss any questions you have with your health care provider. Document Revised: 08/22/2017 Document Reviewed: 09/30/2016 Elsevier Patient Education  2020 [employer].

## 2019-12-09 ENCOUNTER — Other Ambulatory Visit: Payer: Self-pay

## 2019-12-09 ENCOUNTER — Ambulatory Visit (INDEPENDENT_AMBULATORY_CARE_PROVIDER_SITE_OTHER): Payer: BC Managed Care – PPO | Admitting: Adult Health

## 2019-12-09 ENCOUNTER — Other Ambulatory Visit (HOSPITAL_COMMUNITY)
Admission: RE | Admit: 2019-12-09 | Discharge: 2019-12-09 | Disposition: A | Discharge status: Home / Self Care | Payer: BC Managed Care – PPO | Source: Ambulatory Visit | Attending: Adult Health | Admitting: Adult Health

## 2019-12-09 ENCOUNTER — Encounter: Payer: Self-pay | Admitting: Adult Health

## 2019-12-09 VITALS — BP 107/74 | HR 75 | Temp 97.5°F | Wt 137.8 lb

## 2019-12-09 DIAGNOSIS — Z01419 Encounter for gynecological examination (general) (routine) without abnormal findings: Secondary | ICD-10-CM | POA: Diagnosis not present

## 2019-12-09 DIAGNOSIS — R768 Other specified abnormal immunological findings in serum: Secondary | ICD-10-CM

## 2019-12-09 DIAGNOSIS — Z3009 Encounter for other general counseling and advice on contraception: Secondary | ICD-10-CM

## 2019-12-09 DIAGNOSIS — I7300 Raynaud's syndrome without gangrene: Secondary | ICD-10-CM

## 2019-12-09 DIAGNOSIS — M1990 Unspecified osteoarthritis, unspecified site: Secondary | ICD-10-CM | POA: Diagnosis not present

## 2019-12-09 NOTE — Progress Notes (Signed)
Established patient visit      Patient: Brandi Clayton   DOB: August 14, 1991   29 y.o. Female  MRN: [id number] Visit Date: 12/09/2019  Today's healthcare provider: Marcille Buffy, FNP  Subjective:    Chief Complaint  Patient presents with  . Gynecologic Exam  . Contraception   HPI Birth Control consult: Patient presents today to discuss birth control options. Patient previously used oral contraception. She may be interested in Implanon. She is also requesting a pap smear be done today.   She denies any chance of pregnancy, menstrual regular.    She was on Lo- Loestrin in her age 82's she had a lot of yeast infections then and a rash on her chin that resolved when she was taken off.  She has researched Nexplanon implant and would like to try this for birth control.  She denies any history of DVT. She is not a smoker. She is active with exercise and tries to maintain a healthy diet.   She is being seen by rheumatology  at [facility] clinic and has recently given a prescription for Plaquenil, that she has not yet started, she started using compression gloves on her hands at night and these seem to be helping her with her hand symptoms. She has positive RA factor. She reports she has plan to hold off on this medication if she can. She is very compliant and keeps her appointments with rheumatology.  Patient  denies any fever, body aches,chills, rash, chest pain, shortness of breath, nausea, vomiting, or diarrhea.    Patient Active Problem List   Diagnosis Date Noted  . Family history of rheumatoid arthritis 11/17/2019  . Rheumatoid factor positive 11/17/2019  . Inflammatory arthritis 11/11/2019  . Raynaud's disease without gangrene 11/11/2019  . Arthralgia 10/18/2019  . Vitamin D deficiency 10/18/2019  . Routine health maintenance 10/18/2019  . Fatigue 10/18/2019  . Joint swelling 10/18/2019   Past Medical History:  Diagnosis Date  . Allergy   . Arthritis    History  reviewed. No pertinent surgical history. Social History   Tobacco Use  . Smoking status: Never Smoker  . Smokeless tobacco: Never Used  Substance Use Topics  . Alcohol use: Yes  . Drug use: Never   Social History   Socioeconomic History  . Marital status: Married    Spouse name: Not on file  . Number of children: Not on file  . Years of education: Not on file  . Highest education level: Not on file  Occupational History  . Not on file  Tobacco Use  . Smoking status: Never Smoker  . Smokeless tobacco: Never Used  Substance and Sexual Activity  . Alcohol use: Yes  . Drug use: Never  . Sexual activity: Yes  Other Topics Concern  . Not on file  Social History Narrative  . Not on file   Social Determinants of Health   Financial Resource Strain:   . Difficulty of Paying Living Expenses:   Food Insecurity:   . Worried About Charity fundraiser in the Last Year:   . Arboriculturist in the Last Year:   Transportation Needs:   . Film/video editor (Medical):   Marland Kitchen Lack of Transportation (Non-Medical):   Physical Activity:   . Days of Exercise per Week:   . Minutes of Exercise per Session:   Stress:   . Feeling of Stress :   Social Connections:   . Frequency of Communication with Friends and Family:   .  Frequency of Social Gatherings with Friends and Family:   . Attends Religious Services:   . Active Member of Clubs or Organizations:   . Attends Archivist Meetings:   Marland Kitchen Marital Status:   Intimate Partner Violence:   . Fear of Current or Ex-Partner:   . Emotionally Abused:   Marland Kitchen Physically Abused:   . Sexually Abused:    Family Status  Relation Name Status  . Mother  (Not Specified)  . Father  (Not Specified)  . Brother  (Not Specified)  . Ethlyn Daniels  (Not Specified)  . Annamarie Major  (Not Specified)  . MGM  (Not Specified)  . MGF  (Not Specified)  . PGM  (Not Specified)  . PGF  (Not Specified)       Medications: No outpatient medications prior to  visit.   No facility-administered medications prior to visit.    Review of Systems  Constitutional: Negative.   Respiratory: Negative.   Cardiovascular: Negative.   Musculoskeletal: Negative.     Last CBC Lab Results  Component Value Date   WBC 5.1 10/19/2019   HGB 13.3 10/19/2019   HCT 40.1 10/19/2019   MCV 90 10/19/2019   MCH 29.8 10/19/2019   RDW 11.4 (L) 10/19/2019   PLT 288 0000000   Last metabolic panel Lab Results  Component Value Date   GLUCOSE 83 10/19/2019   NA 138 10/19/2019   K 4.1 10/19/2019   CL 101 10/19/2019   CO2 22 10/19/2019   BUN 14 10/19/2019   CREATININE 0.72 10/19/2019   GFRNONAA 115 10/19/2019   GFRAA 133 10/19/2019   CALCIUM 9.5 10/19/2019   PROT 7.2 10/19/2019   ALBUMIN 4.6 10/19/2019   LABGLOB 2.6 10/19/2019   AGRATIO 1.8 10/19/2019   BILITOT 0.3 10/19/2019   ALKPHOS 61 10/19/2019   AST 24 10/19/2019   ALT 17 10/19/2019   Last lipids Lab Results  Component Value Date   CHOL 177 10/19/2019   HDL 61 10/19/2019   LDLCALC 105 (H) 10/19/2019   TRIG 54 10/19/2019   Last hemoglobin A1c No results found for: HGBA1C Last thyroid functions Lab Results  Component Value Date   TSH 2.710 10/19/2019   Last vitamin D Lab Results  Component Value Date   VD25OH 28.1 (L) 10/19/2019    No results found for: VITAMINB12, FOLATE      Objective:    BP 107/74 (BP Location: Left Arm, Patient Position: Sitting, Cuff Size: Normal)   Pulse 75   Temp (!) 97.5 F (36.4 C) (Temporal)   Wt 137 lb 12.8 oz (62.5 kg)   BMI 21.26 kg/m     Physical Exam Constitutional:      General: She is not in acute distress.    Appearance: She is well-developed and well-nourished. She is not diaphoretic.  HENT:     Head: Normocephalic and atraumatic.     Right Ear: External ear normal.     Left Ear: External ear normal.  Eyes:     General: No scleral icterus.       Right eye: No discharge.        Left eye: No discharge.     Conjunctiva/sclera:  Conjunctivae normal.     Pupils: Pupils are equal, round, and reactive to light.  Neck:     Thyroid: No thyromegaly.     Vascular: No JVD.  Pulmonary:     Effort: Pulmonary effort is normal. No respiratory distress.     Breath sounds: Normal breath  sounds. No wheezing or rales.  Chest:     Chest wall: No tenderness.  Abdominal:     General: Bowel sounds are normal. There is no distension.     Palpations: Abdomen is soft. There is no mass.     Tenderness: There is no abdominal tenderness. There is no guarding or rebound.     Hernia: There is no hernia in the right inguinal area or left inguinal area.  Genitourinary:    Exam position: Supine.     Labia: No labial fusion.        Right: No rash, tenderness, lesion or injury.        Left: No rash, tenderness, lesion or injury.      Vagina: Normal. No signs of injury and foreign body. No vaginal discharge, erythema, tenderness or bleeding.     Cervix: Discharge (white thick discharge on speculum ) present. No cervical motion tenderness or friability.     Uterus: Deviated (tilted towards spine, difficult to find cervix initially with speculum, withdrew speculum and had patient to bare down and relax and was able to easily find normal appearing cervix, patinet tolerated exam withiout any diffculty. Bimanual exam was normal ). Not enlarged, not fixed and not tender.      Adnexa:        Right: No mass, tenderness or fullness.         Left: No mass, tenderness or fullness.       Rectum: Normal.  Musculoskeletal:        General: Normal range of motion.     Cervical back: Normal range of motion and neck supple.  Lymphadenopathy:     Lower Body: No right inguinal adenopathy. No left inguinal adenopathy.  Skin:    General: Skin is warm and dry.     Coloration: Skin is not pale.     Findings: No erythema or rash.  Neurological:     Mental Status: She is alert and oriented to person, place, and time.     Coordination: Coordination normal.    Psychiatric:        Mood and Affect: Mood and affect normal.        Behavior: Behavior normal.        Thought Content: Thought content normal.        Judgment: Judgment normal.     Physical Exam  Constitutional: She is oriented to person, place, and time. She appears well-developed and well-nourished. No distress.  HENT:  Head: Normocephalic and atraumatic.  Right Ear: External ear normal.  Left Ear: External ear normal.  Eyes: Pupils are equal, round, and reactive to light. Conjunctivae are normal. Right eye exhibits no discharge. Left eye exhibits no discharge. No scleral icterus.  Neck: No JVD present. No thyromegaly present.  Respiratory: Effort normal and breath sounds normal. No respiratory distress. She has no wheezes. She has no rales. She exhibits no tenderness.  GI: Soft. Bowel sounds are normal. She exhibits no distension and no mass. There is no abdominal tenderness. There is no rebound and no guarding. Hernia confirmed negative in the right inguinal area and confirmed negative in the left inguinal area.  Genitourinary:    Vagina and rectum normal.     Pelvic exam was performed with patient supine.  No labial fusion. There is no rash, tenderness, lesion or injury on the right labia. There is no rash, tenderness, lesion or injury on the left labia. Uterus is deviated (tilted towards spine, difficult to find cervix  initially with speculum, withdrew speculum and had patient to bare down and relax and was able to easily find normal appearing cervix, patinet tolerated exam withiout any diffculty. Bimanual exam was normal ). Uterus is not enlarged, not fixed and not tender. Cervix exhibits discharge (white thick discharge on speculum ). Cervix exhibits no motion tenderness and no friability. Right adnexum displays no mass, no tenderness and no fullness. Left adnexum displays no mass, no tenderness and no fullness.    No vaginal discharge, erythema, tenderness or bleeding.  No erythema,  tenderness or bleeding in the vagina.    No foreign body in the vagina.     No signs of injury in the vagina.   Musculoskeletal:        General: Normal range of motion.     Cervical back: Normal range of motion and neck supple.  Lymphadenopathy:       Right: No inguinal adenopathy present.       Left: No inguinal adenopathy present.  Neurological: She is alert and oriented to person, place, and time. Coordination normal.  Skin: Skin is warm and dry. No rash noted. She is not diaphoretic. No erythema. No pallor.  Psychiatric: She has a normal mood and affect. Her behavior is normal. Judgment and thought content normal.     No results found for any visits on 12/09/19.    Assessment & Plan:   Counseling for birth control, oral contraceptives - Plan: POCT urine pregnancy, Cytology - PAP  Encounter for gynecological examination-12/09/2019 PAP  - Plan: Cytology - PAP  Raynaud's disease without gangrene  Inflammatory arthritis  Rheumatoid factor positive  Vitamin D lab in June, orders in place- lab walk in.   Patient would like for  Brandi Beal, MD, MPH to evaluate and insert Nexplanon for birth control. Discussed with patient and given after visit summary information. She will have pregnancy test on 12/31/19 prior to insertion as this provider does not do these at this time.   She will follow back up with me for primary care or any concerns she may have with her health as discussed. Keep follow up appointments and rheumatology follow up.   Return if symptoms worsen or fail to improve, for at any time for any worsening symptoms, Go to Emergency room/ urgent care if worse.  The entirety of the information documented in the History of Present Illness, Review of Systems and Physical Exam were personally obtained by me. Portions of this information were initially documented by the  Certified Medical Assistant whose name is documented in [place] and reviewed by me for thoroughness and  accuracy.  I have personally performed the exam and reviewed the chart and it is accurate to the best of my knowledge.  Haematologist has been used and any errors in dictation or transcription are unintentional.  Kelby Aline. [facility]      The entirety of the information documented in the History of Present Illness, Review of Systems and Physical Exam were personally obtained by me. Portions of this information were initially documented by the  Certified Medical Assistant whose name is documented in [place] and reviewed by me for thoroughness and accuracy.  I have personally performed the exam and reviewed the chart and it is accurate to the best of my knowledge.  Haematologist has been used and any errors in dictation or transcription are unintentional.  Kelby Aline. [place]  [place], [place] [phone] (phone) [phone] (fax)  [place]

## 2019-12-09 NOTE — Patient Instructions (Signed)
Etonogestrel implant What is this medicine? ETONOGESTREL (et oh noe JES trel) is a contraceptive (birth control) device. It is used to prevent pregnancy. It can be used for up to 3 years. This medicine may be used for other purposes; ask your health care provider or pharmacist if you have questions. COMMON BRAND NAME(S): Implanon, Nexplanon What should I tell my health care provider before I take this medicine? They need to know if you have any of these conditions:  abnormal vaginal bleeding  blood vessel disease or blood clots  breast, cervical, endometrial, ovarian, liver, or uterine cancer  diabetes  gallbladder disease  heart disease or recent heart attack  high blood pressure  high cholesterol or triglycerides  kidney disease  liver disease  migraine headaches  seizures  stroke  tobacco smoker  an unusual or allergic reaction to etonogestrel, anesthetics or antiseptics, other medicines, foods, dyes, or preservatives  pregnant or trying to get pregnant  breast-feeding How should I use this medicine? This device is inserted just under the skin on the inner side of your upper arm by a health care professional. Talk to your pediatrician regarding the use of this medicine in children. Special care may be needed. Overdosage: If you think you have taken too much of this medicine contact a poison control center or emergency room at once. NOTE: This medicine is only for you. Do not share this medicine with others. What if I miss a dose? This does not apply. What may interact with this medicine? Do not take this medicine with any of the following medications:  amprenavir  fosamprenavir This medicine may also interact with the following medications:  acitretin  aprepitant  armodafinil  bexarotene  bosentan  carbamazepine  certain medicines for fungal infections like fluconazole, ketoconazole, itraconazole and voriconazole  certain medicines to treat  hepatitis, HIV or AIDS  cyclosporine  felbamate  griseofulvin  lamotrigine  modafinil  oxcarbazepine  phenobarbital  phenytoin  primidone  rifabutin  rifampin  rifapentine  St. John's wort  topiramate This list may not describe all possible interactions. Give your health care provider a list of all the medicines, herbs, non-prescription drugs, or dietary supplements you use. Also tell them if you smoke, drink alcohol, or use illegal drugs. Some items may interact with your medicine. What should I watch for while using this medicine? This product does not protect you against HIV infection (AIDS) or other sexually transmitted diseases. You should be able to feel the implant by pressing your fingertips over the skin where it was inserted. Contact your doctor if you cannot feel the implant, and use a non-hormonal birth control method (such as condoms) until your doctor confirms that the implant is in place. Contact your doctor if you think that the implant may have broken or become bent while in your arm. You will receive a user card from your health care provider after the implant is inserted. The card is a record of the location of the implant in your upper arm and when it should be removed. Keep this card with your health records. What side effects may I notice from receiving this medicine? Side effects that you should report to your doctor or health care professional as soon as possible:  allergic reactions like skin rash, itching or hives, swelling of the face, lips, or tongue  breast lumps, breast tissue changes, or discharge  breathing problems  changes in emotions or moods  coughing up blood  if you feel that the implant   may have broken or bent while in your arm  high blood pressure  pain, irritation, swelling, or bruising at the insertion site  scar at site of insertion  signs of infection at the insertion site such as fever, and skin redness, pain or  discharge  signs and symptoms of a blood clot such as breathing problems; changes in vision; chest pain; severe, sudden headache; pain, swelling, warmth in the leg; trouble speaking; sudden numbness or weakness of the face, arm or leg  signs and symptoms of liver injury like dark yellow or brown urine; general ill feeling or flu-like symptoms; light-colored stools; loss of appetite; nausea; right upper belly pain; unusually weak or tired; yellowing of the eyes or skin  unusual vaginal bleeding, discharge Side effects that usually do not require medical attention (report to your doctor or health care professional if they continue or are bothersome):  acne  breast pain or tenderness  headache  irregular menstrual bleeding  nausea This list may not describe all possible side effects. Call your doctor for medical advice about side effects. You may report side effects to FDA at 1-800-FDA-1088. Where should I keep my medicine? This drug is given in a hospital or clinic and will not be stored at home. NOTE: This sheet is a summary. It may not cover all possible information. If you have questions about this medicine, talk to your doctor, pharmacist, or health care provider.  2020 Elsevier/Gold Standard (2019-06-01 11:33:04)  

## 2019-12-14 LAB — CYTOLOGY - PAP
Chlamydia: NEGATIVE
Comment: NEGATIVE
Comment: NEGATIVE
Comment: NEGATIVE
Comment: NEGATIVE
Comment: NORMAL
Diagnosis: NEGATIVE
HSV1: NEGATIVE
HSV2: NEGATIVE
High risk HPV: NEGATIVE
Neisseria Gonorrhea: NEGATIVE
Trichomonas: NEGATIVE

## 2019-12-14 NOTE — Progress Notes (Signed)
Result released to MyChart.  Negative PAP smear 12/09/2019 for malignancy or lesion, negative for HPV, Trichomonas, Chlamydia, Gonorrhea, and HSV .  Repeat PAP examination recommended 3 years per guidelines and sooner if clinically indicated. Due next on or after 12/14/2022.

## 2019-12-31 ENCOUNTER — Encounter: Payer: Self-pay | Admitting: Family Medicine

## 2019-12-31 ENCOUNTER — Ambulatory Visit (INDEPENDENT_AMBULATORY_CARE_PROVIDER_SITE_OTHER): Payer: BC Managed Care – PPO | Admitting: Family Medicine

## 2019-12-31 ENCOUNTER — Other Ambulatory Visit: Payer: Self-pay

## 2019-12-31 VITALS — BP 106/82 | HR 88 | Temp 97.1°F | Wt 135.0 lb

## 2019-12-31 DIAGNOSIS — Z30017 Encounter for initial prescription of implantable subdermal contraceptive: Secondary | ICD-10-CM | POA: Diagnosis not present

## 2019-12-31 LAB — POCT URINE PREGNANCY: Preg Test, Ur: NEGATIVE

## 2019-12-31 MED ORDER — ETONOGESTREL 68 MG ~~LOC~~ IMPL
68.0000 mg | DRUG_IMPLANT | Freq: Once | SUBCUTANEOUS | Status: AC
  Administered 2019-12-31: 68 mg via SUBCUTANEOUS

## 2019-12-31 NOTE — Patient Instructions (Signed)
Nexplanon Instructions After Insertion  Keep bandage clean and dry for 24 hours  May use ice/Tylenol/Ibuprofen for soreness or pain  If you develop fever, drainage or increased warmth from incision site-contact office immediately   

## 2019-12-31 NOTE — Addendum Note (Signed)
Addended by: Ashley Royalty E on: 12/31/2019 10:59 AM   Modules accepted: Orders

## 2019-12-31 NOTE — Progress Notes (Signed)
     Established patient visit   Patient: Brandi Clayton   DOB: [birth year]   29 y.o. Female  MRN: [id number]   Today's healthcare provider: Lavon Paganini, MD   Chief Complaint  Patient presents with  . Contraception   Subjective    HPI  Patient desires contraception.  Sexually active with 1 female partner - husband.  No h/o STDs. Never had LARC.  Regular menses.  Social History   Tobacco Use  . Smoking status: Never Smoker  . Smokeless tobacco: Never Used  Substance Use Topics  . Alcohol use: Yes  . Drug use: Never       Medications: Outpatient Medications Prior to Visit  Medication Sig  . hydroxychloroquine (PLAQUENIL) 200 MG tablet One tab once a day,  30 tabs with 2 refills   No facility-administered medications prior to visit.    Review of Systems     Objective    BP 106/82 (BP Location: Left Arm, Patient Position: Sitting, Cuff Size: Normal)   Pulse 88   Temp (!) 97.1 F (36.2 C) (Temporal)   Wt 135 lb (61.2 kg)   BMI 20.83 kg/m     Physical Exam  Sitting comfortably in NAD.   No results found for any visits on 12/31/19.  Assessment & Plan     1. Encounter for initial prescription of Nexplanon - discussed risks and benefits - see procedure note below - etonogestrel (NEXPLANON) implant 68 mg   Patient given informed consent, signed copy in the chart, time out was performed. Pregnancy test was negative. Appropriate time out taken.  Patient's L arm was prepped and draped in the usual sterile fashion.  Insertion site was approximated about 4 cm proximal to the elbow.  Pt was prepped with alcohol swab and then injected with 3cc of 1% lidocaine with epinephrine.  Pt was prepped with betadine, Nexplanon removed form packaging,  Device confirmed in needle, then inserted full length of needle and withdrawn per handbook instructions.  Pt insertion site covered with steri-strip and pressure dressing.   Minimal blood loss.  Pt tolerated the procedure well.      Return if symptoms worsen or fail to improve.      I, Lavon Paganini, MD, have reviewed all documentation for this visit. The documentation on 12/31/19 for the exam, diagnosis, procedures, and orders are all accurate and complete.   Steaven Wholey, Dionne Bucy, MD, MPH [facility]

## 2020-01-04 DIAGNOSIS — F4322 Adjustment disorder with anxiety: Secondary | ICD-10-CM | POA: Diagnosis not present

## 2020-02-01 DIAGNOSIS — F4322 Adjustment disorder with anxiety: Secondary | ICD-10-CM | POA: Diagnosis not present

## 2020-02-08 DIAGNOSIS — F4322 Adjustment disorder with anxiety: Secondary | ICD-10-CM | POA: Diagnosis not present

## 2020-02-15 DIAGNOSIS — F4322 Adjustment disorder with anxiety: Secondary | ICD-10-CM | POA: Diagnosis not present

## 2020-02-25 DIAGNOSIS — F4322 Adjustment disorder with anxiety: Secondary | ICD-10-CM | POA: Diagnosis not present

## 2020-03-07 DIAGNOSIS — F4322 Adjustment disorder with anxiety: Secondary | ICD-10-CM | POA: Diagnosis not present

## 2020-03-13 DIAGNOSIS — F4322 Adjustment disorder with anxiety: Secondary | ICD-10-CM | POA: Diagnosis not present

## 2020-03-28 DIAGNOSIS — F4322 Adjustment disorder with anxiety: Secondary | ICD-10-CM | POA: Diagnosis not present

## 2020-04-04 DIAGNOSIS — F4322 Adjustment disorder with anxiety: Secondary | ICD-10-CM | POA: Diagnosis not present

## 2020-04-14 DIAGNOSIS — F4322 Adjustment disorder with anxiety: Secondary | ICD-10-CM | POA: Diagnosis not present

## 2020-04-18 DIAGNOSIS — F4322 Adjustment disorder with anxiety: Secondary | ICD-10-CM | POA: Diagnosis not present

## 2020-04-25 DIAGNOSIS — F4322 Adjustment disorder with anxiety: Secondary | ICD-10-CM | POA: Diagnosis not present

## 2020-05-02 DIAGNOSIS — F4322 Adjustment disorder with anxiety: Secondary | ICD-10-CM | POA: Diagnosis not present

## 2020-05-09 DIAGNOSIS — F4322 Adjustment disorder with anxiety: Secondary | ICD-10-CM | POA: Diagnosis not present

## 2020-05-16 DIAGNOSIS — F4322 Adjustment disorder with anxiety: Secondary | ICD-10-CM | POA: Diagnosis not present

## 2020-05-17 ENCOUNTER — Ambulatory Visit: Payer: BC Managed Care – PPO | Admitting: Adult Health

## 2020-05-19 NOTE — Progress Notes (Deleted)
     Established patient visit   Patient: Brandi Clayton   DOB: 06/07/1991   29 y.o. Female  MRN: [id number] Visit Date: 05/22/2020  Today's healthcare provider: Marcille Buffy, FNP   No chief complaint on file.  Subjective    HPI  Follow up for Arthralgia   The patient was last seen for this 6 months ago. Changes made at last visit include none, patient to follow up with Dr. Meda Coffee.  She reports {excellent/good/fair/poor:19665} compliance with treatment. She feels that condition is {improved/worse/unchanged:3041574}. She {is/is not:21021397} having side effects. ***  -----------------------------------------------------------------------------------------    {Show patient history (optional):23778::" "}   Medications: Outpatient Medications Prior to Visit  Medication Sig  . hydroxychloroquine (PLAQUENIL) 200 MG tablet One tab once a day,  30 tabs with 2 refills   No facility-administered medications prior to visit.    Review of Systems  {Heme  Chem  Endocrine  Serology  Results Review (optional):23779::" "}  Objective    There were no vitals taken for this visit. {Show previous vital signs (optional):23777::" "}  Physical Exam  ***  No results found for any visits on 05/22/20.  Assessment & Plan     ***  No follow-ups on file.      {provider attestation***:1}   Marcille Buffy, [place] [phone] (phone) [phone] (fax)  [place]

## 2020-05-22 ENCOUNTER — Ambulatory Visit: Payer: BC Managed Care – PPO | Admitting: Adult Health

## 2020-05-23 DIAGNOSIS — F4322 Adjustment disorder with anxiety: Secondary | ICD-10-CM | POA: Diagnosis not present

## 2020-05-30 DIAGNOSIS — F4322 Adjustment disorder with anxiety: Secondary | ICD-10-CM | POA: Diagnosis not present

## 2020-06-06 DIAGNOSIS — F4322 Adjustment disorder with anxiety: Secondary | ICD-10-CM | POA: Diagnosis not present

## 2020-06-13 DIAGNOSIS — F4322 Adjustment disorder with anxiety: Secondary | ICD-10-CM | POA: Diagnosis not present

## 2020-06-20 DIAGNOSIS — F4322 Adjustment disorder with anxiety: Secondary | ICD-10-CM | POA: Diagnosis not present

## 2020-06-21 ENCOUNTER — Other Ambulatory Visit: Payer: Self-pay

## 2020-06-21 ENCOUNTER — Encounter: Payer: Self-pay | Admitting: Adult Health

## 2020-06-21 ENCOUNTER — Ambulatory Visit (INDEPENDENT_AMBULATORY_CARE_PROVIDER_SITE_OTHER): Payer: BC Managed Care – PPO | Admitting: Adult Health

## 2020-06-21 VITALS — BP 102/74 | HR 85 | Temp 98.4°F | Resp 16 | Wt 142.6 lb

## 2020-06-21 DIAGNOSIS — N926 Irregular menstruation, unspecified: Secondary | ICD-10-CM

## 2020-06-21 DIAGNOSIS — E559 Vitamin D deficiency, unspecified: Secondary | ICD-10-CM | POA: Diagnosis not present

## 2020-06-21 NOTE — Progress Notes (Signed)
Established patient visit   Patient: Brandi Clayton   DOB: Dec 20, 1990   29 y.o. Female  MRN: [id number] Visit Date: 06/21/2020  Today's healthcare provider: Marcille Buffy, FNP   Chief Complaint  Patient presents with  . Follow-up   Subjective    HPI  Follow up for Raynaud's disease without gangrene/ inflammatory arthritis   The patient was last seen for this 6 months ago. Changes made at last visit include none. Not taking hydrochloriquine.   She reports good compliance with treatment. She feels that condition is Improved. Patient states that she has decided not to start Hydroxychloroquine and discussed with rheumatologist.    J& J booster - end of April.  She has had Nexplanon since 12/31/2019 her menstrual cycles are less painful. She has some red/ pink light spotting since placement.She is no longer having period cramps like she.  She does not endorse heavy or painful menses.    No LMP recorded. - 3 weeks ago, lasted around 1.5 weeks. Started around 06/02/2020.  -----------------------------------------------------------------------------------------  Patient  denies any fever, body aches,chills, rash, chest pain, shortness of breath, nausea, vomiting, or diarrhea.  Denies dizziness, lightheadedness, pre syncopal or syncopal episodes.    Patient Active Problem List   Diagnosis Date Noted  . Family history of rheumatoid arthritis 11/17/2019  . Rheumatoid factor positive 11/17/2019  . Inflammatory arthritis 11/11/2019  . Raynaud's disease without gangrene 11/11/2019  . Arthralgia 10/18/2019  . Vitamin D deficiency 10/18/2019  . Fatigue 10/18/2019  . Joint swelling 10/18/2019   Past Medical History:  Diagnosis Date  . Allergy   . Arthritis    History reviewed. No pertinent surgical history. Social History   Tobacco Use  . Smoking status: Never Smoker  . Smokeless tobacco: Never Used  Substance Use Topics  . Alcohol use: Yes  . Drug use:  Never   Social History   Socioeconomic History  . Marital status: Married    Spouse name: Not on file  . Number of children: Not on file  . Years of education: Not on file  . Highest education level: Not on file  Occupational History  . Not on file  Tobacco Use  . Smoking status: Never Smoker  . Smokeless tobacco: Never Used  Substance and Sexual Activity  . Alcohol use: Yes  . Drug use: Never  . Sexual activity: Yes  Other Topics Concern  . Not on file  Social History Narrative  . Not on file   Social Determinants of Health   Financial Resource Strain:   . Difficulty of Paying Living Expenses: Not on file  Food Insecurity:   . Worried About Charity fundraiser in the Last Year: Not on file  . Ran Out of Food in the Last Year: Not on file  Transportation Needs:   . Lack of Transportation (Medical): Not on file  . Lack of Transportation (Non-Medical): Not on file  Physical Activity:   . Days of Exercise per Week: Not on file  . Minutes of Exercise per Session: Not on file  Stress:   . Feeling of Stress : Not on file  Social Connections:   . Frequency of Communication with Friends and Family: Not on file  . Frequency of Social Gatherings with Friends and Family: Not on file  . Attends Religious Services: Not on file  . Active Member of Clubs or Organizations: Not on file  . Attends Archivist Meetings: Not on file  . Marital  Status: Not on file  Intimate Partner Violence:   . Fear of Current or Ex-Partner: Not on file  . Emotionally Abused: Not on file  . Physically Abused: Not on file  . Sexually Abused: Not on file   Family Status  Relation Name Status  . Mother  (Not Specified)  . Father  (Not Specified)  . Brother  (Not Specified)  . Ethlyn Daniels  (Not Specified)  . Annamarie Major  (Not Specified)  . MGM  (Not Specified)  . MGF  (Not Specified)  . PGM  (Not Specified)  . PGF  (Not Specified)   Family History  Problem Relation Age of Onset  .  Arthritis Mother   . Arthritis Father   . Depression Brother   . Asthma Brother   . Von Hippel-Lindau syndrome Brother   . Depression Paternal Aunt   . Anemia Paternal Aunt   . Cirrhosis Paternal Aunt   . Hypertension Paternal Aunt   . Hypertension Paternal Uncle   . Anxiety disorder Paternal Uncle   . Depression Paternal Uncle   . Alcohol abuse Paternal Uncle   . GER disease Paternal Uncle   . Arthritis Paternal Uncle   . Arthritis Maternal Grandmother   . COPD Maternal Grandmother   . Anxiety disorder Maternal Grandmother   . Cancer Maternal Grandmother   . Depression Maternal Grandmother   . Cataracts Maternal Grandmother   . Diabetes Maternal Grandmother   . Osteoporosis Maternal Grandmother   . Diabetes Maternal Grandfather   . Heart attack Maternal Grandfather   . Hypertension Maternal Grandfather   . Arthritis Maternal Grandfather   . Cataracts Maternal Grandfather   . COPD Paternal Grandmother   . Asthma Paternal Grandmother   . Depression Paternal Grandmother   . Cancer Paternal Grandmother   . Cataracts Paternal Grandmother   . Alzheimer's disease Paternal Grandmother   . Alcohol abuse Paternal Grandmother   . Diabetes Paternal Grandfather   . Depression Paternal Grandfather   . Hypertension Paternal Grandfather   . Alcohol abuse Paternal Grandfather    Allergies  Allergen Reactions  . Other Rash    Patient reports the [place]       Medications: Outpatient Medications Prior to Visit  Medication Sig  . etonogestrel (NEXPLANON) 68 MG IMPL implant 1 each by Subdermal route once.  Marland Kitchen FLUoxetine (PROZAC) 20 MG capsule Take 20 mg by mouth daily.  . [DISCONTINUED] hydroxychloroquine (PLAQUENIL) 200 MG tablet One tab once a day,  30 tabs with 2 refills   No facility-administered medications prior to visit.    Review of Systems  Last CBC Lab Results  Component Value Date   WBC 5.1 10/19/2019   HGB 13.3 10/19/2019   HCT 40.1 10/19/2019   MCV 90 10/19/2019     MCH 29.8 10/19/2019   RDW 11.4 (L) 10/19/2019   PLT 288 78/29/5621   Last metabolic panel Lab Results  Component Value Date   GLUCOSE 83 10/19/2019   NA 138 10/19/2019   K 4.1 10/19/2019   CL 101 10/19/2019   CO2 22 10/19/2019   BUN 14 10/19/2019   CREATININE 0.72 10/19/2019   GFRNONAA 115 10/19/2019   GFRAA 133 10/19/2019   CALCIUM 9.5 10/19/2019   PROT 7.2 10/19/2019   ALBUMIN 4.6 10/19/2019   LABGLOB 2.6 10/19/2019   AGRATIO 1.8 10/19/2019   BILITOT 0.3 10/19/2019   ALKPHOS 61 10/19/2019   AST 24 10/19/2019   ALT 17 10/19/2019   Last lipids Lab Results  Component Value Date   CHOL 177 10/19/2019   HDL 61 10/19/2019   LDLCALC 105 (H) 10/19/2019   TRIG 54 10/19/2019   Last hemoglobin A1c No results found for: HGBA1C Last thyroid functions Lab Results  Component Value Date   TSH 2.710 10/19/2019   Last vitamin D Lab Results  Component Value Date   VD25OH 28.1 (L) 10/19/2019   Last vitamin B12 and Folate No results found for: VITAMINB12, FOLATE    Objective    BP 102/74   Pulse 85   Temp 98.4 F (36.9 C) (Oral)   Resp 16   Wt 142 lb 9.6 oz (64.7 kg)   SpO2 100%   BMI 22.00 kg/m  BP Readings from Last 3 Encounters:  06/21/20 102/74  12/31/19 106/82  12/09/19 107/74      Physical Exam Constitutional:      General: She is not in acute distress.    Appearance: Normal appearance. She is not ill-appearing, toxic-appearing or diaphoretic.  HENT:     Head: Normocephalic and atraumatic.     Right Ear: Tympanic membrane, ear canal and external ear normal. There is no impacted cerumen.     Left Ear: Tympanic membrane, ear canal and external ear normal. There is no impacted cerumen.  Eyes:     General: No scleral icterus.    Conjunctiva/sclera: Conjunctivae normal.  Cardiovascular:     Rate and Rhythm: Normal rate and regular rhythm.     Pulses: Normal pulses.     Heart sounds: Normal heart sounds. No murmur heard.  No friction rub. No gallop.    Pulmonary:     Effort: Pulmonary effort is normal. No respiratory distress.     Breath sounds: Normal breath sounds. No stridor. No wheezing, rhonchi or rales.  Chest:     Chest wall: No tenderness.  Abdominal:     Palpations: Abdomen is soft.  Skin:    Capillary Refill: Capillary refill takes less than 2 seconds.  Neurological:     Mental Status: She is alert. Mental status is at baseline.     Gait: Gait normal.  Psychiatric:        Mood and Affect: Mood normal.        Behavior: Behavior normal.        Thought Content: Thought content normal.        Judgment: Judgment normal.      No results found for any visits on 06/21/20.  Assessment & Plan     Irregular menstruation - Plan: CBC with Differential/Platelet, Comprehensive Metabolic Panel (CMET), US Pelvic Complete With Transvaginal  Vitamin D deficiency - Plan: VITAMIN D 25 Hydroxy (Vit-D Deficiency, Fractures)  Menses is improving with Nexplanon, just slightly still irregular length for patient, lasting 1.5 weeks but light flow the second part with mostly pink spotting. Discussed usually takes around 6 months to regulate with implant. She is pleased with less menstrual cramps and prefers to wait 1-2 more months to see if regulates.   Did offer Pelvic ultrasound,order placed. Follow up labs also ordered.  Orders Placed This Encounter  Procedures  . US Pelvic Complete With Transvaginal  . CBC with Differential/Platelet  . Comprehensive Metabolic Panel (CMET)  . VITAMIN D 25 Hydroxy (Vit-D Deficiency, Fractures)     Return in about 1 month (around 07/22/2020), or if symptoms worsen or fail to improve, for at any time for any worsening symptoms, Go to Emergency room/ urgent care if worse.  Marcille Buffy, [place] [phone] (phone) [phone] (fax)  [place]

## 2020-06-21 NOTE — Patient Instructions (Signed)
Dysmenorrhea  Dysmenorrhea refers to cramps caused by the muscles of the uterus tightening (contracting) during a menstrual period. Dysmenorrhea may be mild, or it may be severe enough to interfere with everyday activities for a few days each month. Primary dysmenorrhea is menstrual cramps that last a couple of days when you start having menstrual periods or soon after. This often begins after a teenager starts having her period. As a woman gets older or has a baby, the cramps will usually lessen or disappear. Secondary dysmenorrhea begins later in life and is caused by a disorder in the reproductive system. It lasts longer, and it may cause more pain than primary dysmenorrhea. The pain may start before the period and last a few days after the period. What are the causes? Dysmenorrhea is usually caused by an underlying problem, such as:  The tissue that lines the uterus (endometrium) growing outside of the uterus in other areas of the body (endometriosis).  Endometrial tissue growing into the muscular walls of the uterus (adenomyosis).  Blood vessels in the pelvis becoming filled with blood just before the menstrual period (pelvic congestive syndrome).  Overgrowth of cells (polyps) in the endometrium or the lower part of the uterus (cervix).  The uterus dropping down into the vagina (prolapse) due to stretched or weak muscles.  Bladder problems, such as infection or inflammation.  Intestinal problems, such as a tumor or irritable bowel syndrome.  Cancer of the reproductive organs or bladder.  A severely tipped uterus.  A cervix that is closed or has a very small opening.  Noncancerous (benign) tumors of the uterus (fibroids).  Pelvic inflammatory disease (PID).  Pelvic scarring (adhesions) from a previous surgery.  An ovarian cyst.  An IUD (intrauterine device). What increases the risk? You are more likely to develop this condition if:  You are younger than age 67.  You  started puberty early.  You have irregular or heavy bleeding.  You have never given birth.  You have a family history of dysmenorrhea.  You smoke. What are the signs or symptoms? Symptoms of this condition include:  Cramping, throbbing pain, or a feeling of fullness in the lower abdomen.  Lower back pain.  Periods lasting for longer than 7 days.  Headaches.  Bloating.  Fatigue.  Nausea or vomiting.  Diarrhea.  Sweating or dizziness.  Loose stools. How is this diagnosed? This condition may be diagnosed based on:  Your symptoms.  Your medical history.  A physical exam.  Blood tests.  A Pap test. This is a test in which cells from the cervix are tested for signs of cancer or infection.  A pregnancy test.  Imaging tests, such as: ? Ultrasound. ? A procedure to remove and examine a sample of endometrial tissue (dilation and curettage, D&C). ? A procedure to visually examine the inside of:  The uterus (hysteroscopy).  The abdomen or pelvis (laparoscopy).  The bladder (cystoscopy).  The intestine (colonoscopy).  The stomach (gastroscopy). ? X-rays. ? CT scan. ? MRI. How is this treated? Treatment depends on the cause of the dysmenorrhea. Treatment may include:  Pain medicine prescribed by your health care provider.  Birth control pills that contain the hormone progesterone.  An IUD that contains the hormone progesterone.  Medicines to control bleeding.  Hormone replacement therapy.  NSAIDs. These may help to stop the production of hormones that cause cramps.  Antidepressant medicines.  Surgery to remove adhesions, endometriosis, ovarian cysts, fibroids, or the entire uterus (hysterectomy).  Injections of progesterone  to stop the menstrual period.  A procedure to destroy the endometrium (endometrial ablation).  A procedure to cut the nerves in the bottom of the spine (sacrum) that go to the reproductive organs (presacral neurectomy).  A  procedure to apply an electric current to nerves in the sacrum (sacral nerve stimulation).  Exercise and physical therapy.  Meditation and yoga therapy.  Acupuncture. Work with your health care provider to determine what treatment or combination of treatments is best for you. Follow these instructions at home: Relieving pain and cramping  Apply heat to your lower back or abdomen when you experience pain or cramps. Use the heat source that your health care provider recommends, such as a moist heat pack or a heating pad. ? Place a towel between your skin and the heat source. ? Leave the heat on for 20-30 minutes. ? Remove the heat if your skin turns bright red. This is especially important if you are unable to feel pain, heat, or cold. You may have a greater risk of getting burned. ? Do not sleep with a heating pad on.  Do aerobic exercises, such as walking, swimming, or biking. This can help to relieve cramps.  Massage your lower back or abdomen to help relieve pain. General instructions  Take over-the-counter and prescription medicines only as told by your health care provider.  Do not drive or use heavy machinery while taking prescription pain medicine.  Avoid alcohol and caffeine during and right before your menstrual period. These can make cramps worse.  Do not use any products that contain nicotine or tobacco, such as cigarettes and e-cigarettes. If you need help quitting, ask your health care provider.  Keep all follow-up visits as told by your health care provider. This is important. Contact a health care provider if:  You have pain that gets worse or does not get better with medicine.  You have pain with sex.  You develop nausea or vomiting with your period that is not controlled with medicine. Get help right away if:  You faint. Summary  Dysmenorrhea refers to cramps caused by the muscles of the uterus tightening (contracting) during a menstrual  period.  Dysmenorrhea may be mild, or it may be severe enough to interfere with everyday activities for a few days each month.  Treatment depends on the cause of the dysmenorrhea.  Work with your health care provider to determine what treatment or combination of treatments is best for you. This information is not intended to replace advice given to you by your health care provider. Make sure you discuss any questions you have with your health care provider. Document Revised: 08/01/2017 Document Reviewed: 09/21/2016 Elsevier Patient Education  2020 [employer] [place] implant What is this medicine? ETONOGESTREL (et oh noe JES trel) is a contraceptive (birth control) device. It is used to prevent pregnancy. It can be used for up to 3 years. This medicine may be used for other purposes; ask your health care provider or pharmacist if you have questions. COMMON BRAND NAME(S): Implanon, Nexplanon What should I tell my health care provider before I take this medicine? They need to know if you have any of these conditions:  abnormal vaginal bleeding  blood vessel disease or blood clots  breast, cervical, endometrial, ovarian, liver, or uterine cancer  diabetes  gallbladder disease  heart disease or recent heart attack  high blood pressure  high cholesterol or triglycerides  kidney disease  liver disease  migraine headaches  seizures  stroke  tobacco  smoker  an unusual or allergic reaction to etonogestrel, anesthetics or antiseptics, other medicines, foods, dyes, or preservatives  pregnant or trying to get pregnant  breast-feeding How should I use this medicine? This device is inserted just under the skin on the inner side of your upper arm by a health care professional. Talk to your pediatrician regarding the use of this medicine in children. Special care may be needed. Overdosage: If you think you have taken too much of this medicine contact a poison control  center or emergency room at once. NOTE: This medicine is only for you. Do not share this medicine with others. What if I miss a dose? This does not apply. What may interact with this medicine? Do not take this medicine with any of the following medications:  amprenavir  fosamprenavir This medicine may also interact with the following medications:  acitretin  aprepitant  armodafinil  bexarotene  bosentan  carbamazepine  certain medicines for fungal infections like fluconazole, ketoconazole, itraconazole and voriconazole  certain medicines to treat hepatitis, HIV or AIDS  cyclosporine  felbamate  griseofulvin  lamotrigine  modafinil  oxcarbazepine  phenobarbital  phenytoin  primidone  rifabutin  rifampin  rifapentine  St. John's wort  topiramate This list may not describe all possible interactions. Give your health care provider a list of all the medicines, herbs, non-prescription drugs, or dietary supplements you use. Also tell them if you smoke, drink alcohol, or use illegal drugs. Some items may interact with your medicine. What should I watch for while using this medicine? This product does not protect you against HIV infection (AIDS) or other sexually transmitted diseases. You should be able to feel the implant by pressing your fingertips over the skin where it was inserted. Contact your doctor if you cannot feel the implant, and use a non-hormonal birth control method (such as condoms) until your doctor confirms that the implant is in place. Contact your doctor if you think that the implant may have broken or become bent while in your arm. You will receive a user card from your health care provider after the implant is inserted. The card is a record of the location of the implant in your upper arm and when it should be removed. Keep this card with your health records. What side effects may I notice from receiving this medicine? Side effects that you  should report to your doctor or health care professional as soon as possible:  allergic reactions like skin rash, itching or hives, swelling of the face, lips, or tongue  breast lumps, breast tissue changes, or discharge  breathing problems  changes in emotions or moods  coughing up blood  if you feel that the implant may have broken or bent while in your arm  high blood pressure  pain, irritation, swelling, or bruising at the insertion site  scar at site of insertion  signs of infection at the insertion site such as fever, and skin redness, pain or discharge  signs and symptoms of a blood clot such as breathing problems; changes in vision; chest pain; severe, sudden headache; pain, swelling, warmth in the leg; trouble speaking; sudden numbness or weakness of the face, arm or leg  signs and symptoms of liver injury like dark yellow or brown urine; general ill feeling or flu-like symptoms; light-colored stools; loss of appetite; nausea; right upper belly pain; unusually weak or tired; yellowing of the eyes or skin  unusual vaginal bleeding, discharge Side effects that usually do not require medical  attention (report to your doctor or health care professional if they continue or are bothersome):  acne  breast pain or tenderness  headache  irregular menstrual bleeding  nausea This list may not describe all possible side effects. Call your doctor for medical advice about side effects. You may report side effects to FDA at 1-800-FDA-1088. Where should I keep my medicine? This drug is given in a hospital or clinic and will not be stored at home. NOTE: This sheet is a summary. It may not cover all possible information. If you have questions about this medicine, talk to your doctor, pharmacist, or health care provider.  2020 Elsevier/Gold Standard (2019-06-01 11:33:04)

## 2020-06-27 DIAGNOSIS — F4322 Adjustment disorder with anxiety: Secondary | ICD-10-CM | POA: Diagnosis not present

## 2020-06-30 ENCOUNTER — Ambulatory Visit: Payer: BC Managed Care – PPO

## 2020-07-04 DIAGNOSIS — F4322 Adjustment disorder with anxiety: Secondary | ICD-10-CM | POA: Diagnosis not present

## 2020-07-11 DIAGNOSIS — F4322 Adjustment disorder with anxiety: Secondary | ICD-10-CM | POA: Diagnosis not present

## 2020-07-13 ENCOUNTER — Encounter: Payer: Self-pay | Admitting: Adult Health

## 2020-07-26 ENCOUNTER — Ambulatory Visit: Payer: BC Managed Care – PPO | Admitting: Adult Health

## 2020-08-01 ENCOUNTER — Ambulatory Visit: Payer: BC Managed Care – PPO

## 2020-08-01 DIAGNOSIS — F4322 Adjustment disorder with anxiety: Secondary | ICD-10-CM | POA: Diagnosis not present

## 2020-08-04 ENCOUNTER — Ambulatory Visit (INDEPENDENT_AMBULATORY_CARE_PROVIDER_SITE_OTHER): Payer: BC Managed Care – PPO | Admitting: Adult Health

## 2020-08-04 ENCOUNTER — Other Ambulatory Visit: Payer: Self-pay

## 2020-08-04 DIAGNOSIS — Z23 Encounter for immunization: Secondary | ICD-10-CM | POA: Diagnosis not present

## 2020-08-06 NOTE — Addendum Note (Signed)
Addended by: Doreen Beam on: 08/06/2020 05:40 PM   Modules accepted: Level of Service

## 2020-08-06 NOTE — Progress Notes (Signed)
covid booster only. Influenza vaccine,  Nurse visit.

## 2020-08-08 DIAGNOSIS — F4322 Adjustment disorder with anxiety: Secondary | ICD-10-CM | POA: Diagnosis not present

## 2020-08-09 ENCOUNTER — Ambulatory Visit (INDEPENDENT_AMBULATORY_CARE_PROVIDER_SITE_OTHER): Payer: BC Managed Care – PPO | Admitting: Dermatology

## 2020-08-09 ENCOUNTER — Other Ambulatory Visit: Payer: Self-pay

## 2020-08-09 DIAGNOSIS — D229 Melanocytic nevi, unspecified: Secondary | ICD-10-CM

## 2020-08-09 DIAGNOSIS — L309 Dermatitis, unspecified: Secondary | ICD-10-CM

## 2020-08-09 DIAGNOSIS — L578 Other skin changes due to chronic exposure to nonionizing radiation: Secondary | ICD-10-CM | POA: Diagnosis not present

## 2020-08-09 MED ORDER — HYDROCORTISONE 2.5 % EX CREA: TOPICAL_CREAM | CUTANEOUS | 4 refills | Status: AC

## 2020-08-09 MED ORDER — TRIAMCINOLONE ACETONIDE 0.1 % EX CREA: TOPICAL_CREAM | CUTANEOUS | 4 refills | Status: AC

## 2020-08-09 NOTE — Progress Notes (Signed)
   New Patient Visit  Subjective  Brandi Clayton is a 29 y.o. female who presents for the following: Spot Check (Pt has a few spots that she would like checked today. One on her back that sometimes gets caught on clothing. Also a few spots on her left thigh. No hx of skin cancer. No hx of dysplastic nevi. ).  Pt also states that she gets dry, scaly, itchy areas around the body that move around for years.  Objective  Well appearing patient in no apparent distress; mood and affect are within normal limits.  A focused examination was performed including face, neck, chest, back and left leg. Relevant physical exam findings are noted in the Assessment and Plan.  Objective  face: Scaly erythematous patches  Assessment & Plan  Eczema, unspecified type face  Chronic condition, no cure, only control. Not currently at goal.    Reviewed gentle skin care, chronic nature.  Start hydrocortisone 2.5 % cream. Apply to affected areas on face, axilla, or groin area twice a day as needed for itch up to 2 weeks.  Start Triamcinolone 0.1% cream twice a day as needed to affected areas on body for up to 2 weeks. Avoid applying to face, groin, and axilla. Use as directed. Risk of skin atrophy with long-term use reviewed.   Topical steroids (such as triamcinolone, fluocinolone, fluocinonide, mometasone, clobetasol, halobetasol, betamethasone, hydrocortisone) can cause thinning and lightening of the skin if they are used for too long in the same area. Your physician has selected the right strength medicine for your problem and area affected on the body. Please use your medication only as directed by your physician to prevent side effects.     hydrocortisone 2.5 % cream - face  triamcinolone (KENALOG) 0.1 % - face  Actinic Damage - chronic, secondary to cumulative UV radiation exposure/sun exposure over time - diffuse scaly erythematous macules with underlying dyspigmentation - Recommend daily broad  spectrum sunscreen SPF 30+ to sun-exposed areas, reapply every 2 hours as needed.  - Call for new or changing lesions.  Melanocytic Nevi Back, left upper thigh - Tan-brown and/or pink-flesh-colored symmetric macules and papules - Benign appearing on exam today - Observation - Call clinic for new or changing moles - Recommend daily use of broad spectrum spf 30+ sunscreen to sun-exposed areas.    Return if symptoms worsen or fail to improve.   I, Harriett Sine, CMA, am acting as scribe for Forest Gleason, MD.  Documentation: I have reviewed the above documentation for accuracy and completeness, and I agree with the above.  Forest Gleason, MD

## 2020-08-09 NOTE — Patient Instructions (Addendum)
Rheumatologist can check for Sjogren Syndrome.     Gentle Skin Care Guide  1. Bathe no more than once a day.  2. Avoid bathing in hot water  3. Use a mild soap like Dove, Vanicream, Cetaphil, CeraVe. Can use Lever 2000 or Cetaphil antibacterial soap  4. Use soap only where you need it. On most days, use it under your arms, between your legs, and on your feet. Let the water rinse other areas unless visibly dirty.  5. When you get out of the bath/shower, use a towel to gently blot your skin dry, don't rub it.  6. While your skin is still a little damp, apply a moisturizing cream such as Vanicream, CeraVe, Cetaphil, Eucerin, Sarna lotion or plain Vaseline Jelly. For hands apply Neutrogena [place] Hand Cream or Excipial Hand Cream.  7. Reapply moisturizer any time you start to itch or feel dry.  8. Sometimes using free and clear laundry detergents can be helpful. Fabric softener sheets should be avoided. Downy Free & Gentle liquid, or any liquid fabric softener that is free of dyes and perfumes, it acceptable to use  9. If your doctor has given you prescription creams you may apply moisturizers over them      Recommend daily broad spectrum sunscreen SPF 30+ to sun-exposed areas, reapply every 2 hours as needed. Call for new or changing lesions.     Melanoma ABCDEs  Melanoma is the most dangerous type of skin cancer, and is the leading cause of death from skin disease.  You are more likely to develop melanoma if you:  Have light-colored skin, light-colored eyes, or red or blond hair  Spend a lot of time in the sun  Tan regularly, either outdoors or in a tanning bed  Have had blistering sunburns, especially during childhood  Have a close family member who has had a melanoma  Have atypical moles or large birthmarks  Early detection of melanoma is key since treatment is typically straightforward and cure rates are extremely high if we catch it early.   The first sign of  melanoma is often a change in a mole or a new dark spot.  The ABCDE system is a way of remembering the signs of melanoma.  A for asymmetry:  The two halves do not match. B for border:  The edges of the growth are irregular. C for color:  A mixture of colors are present instead of an even brown color. D for diameter:  Melanomas are usually (but not always) greater than 60mm - the size of a pencil eraser. E for evolution:  The spot keeps changing in size, shape, and color.  Please check your skin once per month between visits. You can use a small mirror in front and a large mirror behind you to keep an eye on the back side or your body.   If you see any new or changing lesions before your next follow-up, please call to schedule a visit.  Please continue daily skin protection including broad spectrum sunscreen SPF 30+ to sun-exposed areas, reapplying every 2 hours as needed when you're outdoors.       Recommend taking Heliocare sun protection supplement daily in sunny weather for additional sun protection. For maximum protection on the sunniest days, you can take up to 2 capsules of regular Heliocare OR take 1 capsule of Heliocare Ultra. For prolonged exposure (such as a full day in the sun), you can repeat your dose of the supplement 4 hours after your  first dose. Heliocare can be purchased at [facility] or at [email or web address].

## 2020-08-10 ENCOUNTER — Encounter: Payer: Self-pay | Admitting: Dermatology

## 2020-08-15 ENCOUNTER — Other Ambulatory Visit: Payer: Self-pay

## 2020-08-15 ENCOUNTER — Encounter: Payer: Self-pay | Admitting: Family Medicine

## 2020-08-15 ENCOUNTER — Ambulatory Visit (INDEPENDENT_AMBULATORY_CARE_PROVIDER_SITE_OTHER): Payer: BC Managed Care – PPO | Admitting: Family Medicine

## 2020-08-15 VITALS — BP 120/81 | HR 79 | Temp 98.4°F | Resp 16 | Ht 67.0 in | Wt 142.2 lb

## 2020-08-15 DIAGNOSIS — Z3046 Encounter for surveillance of implantable subdermal contraceptive: Secondary | ICD-10-CM | POA: Diagnosis not present

## 2020-08-15 NOTE — Patient Instructions (Signed)
Nexplanon Instructions After Removal  Keep bandage clean and dry for 24 hours  May use ice/Tylenol/Ibuprofen for soreness or pain  If you develop fever, drainage or increased warmth from incision site-contact office immediately   

## 2020-08-15 NOTE — Progress Notes (Signed)
      Established patient visit   Patient: Brandi Clayton   DOB: 13-Oct-1990   29 y.o. Female  MRN: [id number] Visit Date: 08/15/2020  Today's healthcare provider: Lavon Paganini, MD   Chief Complaint  Patient presents with  . Contraception    Nexplaeon removal   Subjective    HPI HPI    Contraception     Additional comments: Nexplaeon removal       Last edited by Sylvester Harder, [place] on 08/15/2020  1:09 PM. (History)       Having longer bleeding cycles. Wants nexplanon removed. No desire for alternative contraception at this time  Social History   Tobacco Use  . Smoking status: Never Smoker  . Smokeless tobacco: Never Used  Substance Use Topics  . Alcohol use: Yes  . Drug use: Never       Medications: Outpatient Medications Prior to Visit  Medication Sig  . etonogestrel (NEXPLANON) 68 MG IMPL implant 1 each by Subdermal route once.  Marland Kitchen FLUoxetine (PROZAC) 20 MG capsule Take 20 mg by mouth daily.  . hydrocortisone 2.5 % cream Apply to affected areas as directed  . triamcinolone (KENALOG) 0.1 % Apply to affected areas as directed.   No facility-administered medications prior to visit.    Review of Systems    Objective    BP 120/81 (BP Location: Left Arm, Patient Position: Sitting, Cuff Size: Large)   Pulse 79   Temp 98.4 F (36.9 C) (Oral)   Resp 16   Ht 5\' 7"  (1.702 m)   Wt 142 lb 3.2 oz (64.5 kg)   BMI 22.27 kg/m    Physical Exam    No results found for any visits on 08/15/20.  Assessment & Plan     1. Encounter for Nexplanon removal  PROCEDURE NOTE: [place] Patient given informed consent and signed copy in the chart for both procedures. An appropriate time out was been taken  L  arm area prepped and draped in the usual sterile fashion. Three cc of 1% lidocaine with epinephrine used for local anesthesia. A small stab incision was made close to the nexplanon with scalpel. Hemostats were used to withdraw the  Nexplanon.  There were no complications.  Pressure bandage was  applied to decrease bruising. Patient given follow up instructions should she experience redness, swelling at sight or fever in the next 24 hours.  Return if symptoms worsen or fail to improve.      I, Lavon Paganini, MD, have reviewed all documentation for this visit. The documentation on 08/15/20 for the exam, diagnosis, procedures, and orders are all accurate and complete.   Knut Rondinelli, Dionne Bucy, MD, MPH [facility]

## 2020-08-17 ENCOUNTER — Other Ambulatory Visit: Payer: Self-pay

## 2020-08-17 ENCOUNTER — Ambulatory Visit
Admission: RE | Admit: 2020-08-17 | Discharge: 2020-08-17 | Disposition: A | Discharge status: Home / Self Care | Payer: BC Managed Care – PPO | Source: Ambulatory Visit | Attending: Adult Health | Admitting: Adult Health

## 2020-08-17 DIAGNOSIS — N926 Irregular menstruation, unspecified: Secondary | ICD-10-CM | POA: Diagnosis not present

## 2020-08-17 DIAGNOSIS — N83291 Other ovarian cyst, right side: Secondary | ICD-10-CM | POA: Diagnosis not present

## 2020-08-22 DIAGNOSIS — F4322 Adjustment disorder with anxiety: Secondary | ICD-10-CM | POA: Diagnosis not present

## 2020-08-22 NOTE — Progress Notes (Signed)
Patient does have a right ovarian cyst measuring 2.8 cm, no further follow-up or imaging is recommended if patient has no family history or genetic family history of tumors or ovarian cancer.  Otherwise this is a negative pelvic ultrasound.  I do recommend that she see gynecology for gynecological exams and okay to refer if patient in agreement.

## 2020-09-05 DIAGNOSIS — F4322 Adjustment disorder with anxiety: Secondary | ICD-10-CM | POA: Diagnosis not present

## 2020-09-19 DIAGNOSIS — F4322 Adjustment disorder with anxiety: Secondary | ICD-10-CM | POA: Diagnosis not present

## 2020-09-26 DIAGNOSIS — F4322 Adjustment disorder with anxiety: Secondary | ICD-10-CM | POA: Diagnosis not present

## 2020-10-03 DIAGNOSIS — F4322 Adjustment disorder with anxiety: Secondary | ICD-10-CM | POA: Diagnosis not present

## 2020-10-10 DIAGNOSIS — F4322 Adjustment disorder with anxiety: Secondary | ICD-10-CM | POA: Diagnosis not present

## 2020-10-17 DIAGNOSIS — F4322 Adjustment disorder with anxiety: Secondary | ICD-10-CM | POA: Diagnosis not present

## 2020-10-24 DIAGNOSIS — F4322 Adjustment disorder with anxiety: Secondary | ICD-10-CM | POA: Diagnosis not present

## 2020-10-31 DIAGNOSIS — F4322 Adjustment disorder with anxiety: Secondary | ICD-10-CM | POA: Diagnosis not present

## 2020-11-07 DIAGNOSIS — F4322 Adjustment disorder with anxiety: Secondary | ICD-10-CM | POA: Diagnosis not present

## 2020-11-14 DIAGNOSIS — F4322 Adjustment disorder with anxiety: Secondary | ICD-10-CM | POA: Diagnosis not present

## 2020-11-28 DIAGNOSIS — F4322 Adjustment disorder with anxiety: Secondary | ICD-10-CM | POA: Diagnosis not present

## 2020-12-05 DIAGNOSIS — F4322 Adjustment disorder with anxiety: Secondary | ICD-10-CM | POA: Diagnosis not present

## 2020-12-12 DIAGNOSIS — F4322 Adjustment disorder with anxiety: Secondary | ICD-10-CM | POA: Diagnosis not present

## 2020-12-19 DIAGNOSIS — F4322 Adjustment disorder with anxiety: Secondary | ICD-10-CM | POA: Diagnosis not present

## 2020-12-27 DIAGNOSIS — F4322 Adjustment disorder with anxiety: Secondary | ICD-10-CM | POA: Diagnosis not present

## 2021-01-01 ENCOUNTER — Encounter: Payer: BC Managed Care – PPO | Admitting: Obstetrics and Gynecology

## 2021-01-02 DIAGNOSIS — F4322 Adjustment disorder with anxiety: Secondary | ICD-10-CM | POA: Diagnosis not present

## 2021-01-09 DIAGNOSIS — F4322 Adjustment disorder with anxiety: Secondary | ICD-10-CM | POA: Diagnosis not present

## 2021-01-16 DIAGNOSIS — F4322 Adjustment disorder with anxiety: Secondary | ICD-10-CM | POA: Diagnosis not present

## 2021-01-23 DIAGNOSIS — F4322 Adjustment disorder with anxiety: Secondary | ICD-10-CM | POA: Diagnosis not present

## 2021-01-30 DIAGNOSIS — F4322 Adjustment disorder with anxiety: Secondary | ICD-10-CM | POA: Diagnosis not present

## 2021-02-13 DIAGNOSIS — F4322 Adjustment disorder with anxiety: Secondary | ICD-10-CM | POA: Diagnosis not present

## 2021-03-06 DIAGNOSIS — F419 Anxiety disorder, unspecified: Secondary | ICD-10-CM | POA: Diagnosis not present

## 2021-03-20 DIAGNOSIS — F419 Anxiety disorder, unspecified: Secondary | ICD-10-CM | POA: Diagnosis not present

## 2021-03-27 DIAGNOSIS — F419 Anxiety disorder, unspecified: Secondary | ICD-10-CM | POA: Diagnosis not present

## 2021-04-03 DIAGNOSIS — F419 Anxiety disorder, unspecified: Secondary | ICD-10-CM | POA: Diagnosis not present

## 2021-04-24 DIAGNOSIS — F419 Anxiety disorder, unspecified: Secondary | ICD-10-CM | POA: Diagnosis not present

## 2021-05-08 DIAGNOSIS — F419 Anxiety disorder, unspecified: Secondary | ICD-10-CM | POA: Diagnosis not present

## 2021-05-15 DIAGNOSIS — F419 Anxiety disorder, unspecified: Secondary | ICD-10-CM | POA: Diagnosis not present

## 2021-06-12 DIAGNOSIS — F419 Anxiety disorder, unspecified: Secondary | ICD-10-CM | POA: Diagnosis not present

## 2021-06-26 DIAGNOSIS — F419 Anxiety disorder, unspecified: Secondary | ICD-10-CM | POA: Diagnosis not present

## 2021-07-10 DIAGNOSIS — F419 Anxiety disorder, unspecified: Secondary | ICD-10-CM | POA: Diagnosis not present

## 2021-08-07 ENCOUNTER — Other Ambulatory Visit: Payer: Self-pay

## 2021-08-07 ENCOUNTER — Ambulatory Visit (INDEPENDENT_AMBULATORY_CARE_PROVIDER_SITE_OTHER): Payer: BC Managed Care – PPO | Admitting: Obstetrics and Gynecology

## 2021-08-07 ENCOUNTER — Encounter: Payer: Self-pay | Admitting: Obstetrics and Gynecology

## 2021-08-07 ENCOUNTER — Other Ambulatory Visit: Payer: Self-pay | Admitting: Obstetrics and Gynecology

## 2021-08-07 VITALS — BP 125/78 | HR 65 | Resp 16 | Ht 67.5 in | Wt 151.0 lb

## 2021-08-07 DIAGNOSIS — Z01419 Encounter for gynecological examination (general) (routine) without abnormal findings: Secondary | ICD-10-CM

## 2021-08-07 DIAGNOSIS — Z30011 Encounter for initial prescription of contraceptive pills: Secondary | ICD-10-CM | POA: Diagnosis not present

## 2021-08-07 DIAGNOSIS — F419 Anxiety disorder, unspecified: Secondary | ICD-10-CM | POA: Diagnosis not present

## 2021-08-07 MED ORDER — LO LOESTRIN FE 1 MG-10 MCG / 10 MCG PO TABS: 1.0000 | ORAL_TABLET | Freq: Every day | ORAL | 3 refills | Status: DC

## 2021-08-07 NOTE — Progress Notes (Signed)
HPI:      [name] is a 30 y.o. No obstetric history on file. who LMP was Patient's last menstrual period was 07/23/2021 (approximate).  Subjective:   She presents today for her annual examination.  She has no complaints.  She would like to start OCPs.  She has taken OCPs before and she has taken them successfully.  She is currently using condoms and cycle timing for birth control.  She has normal regular cycles.     Hx: The following portions of the patient's history were reviewed and updated as appropriate:             She  has a past medical history of Allergy, Arthritis, and Eczema. She does not have any pertinent problems on file. She  has no past surgical history on file. Her family history includes Alcohol abuse in her paternal grandfather, paternal grandmother, and paternal uncle; Alzheimer's disease in her paternal grandmother; Anemia in her paternal aunt; Anxiety disorder in her maternal grandmother and paternal uncle; Arthritis in her father, maternal grandfather, maternal grandmother, mother, and paternal uncle; Asthma in her brother and paternal grandmother; COPD in her maternal grandmother and paternal grandmother; Cancer in her maternal grandmother and paternal grandmother; Cataracts in her maternal grandfather, maternal grandmother, and paternal grandmother; Cirrhosis in her paternal aunt; Depression in her brother, maternal grandmother, paternal aunt, paternal grandfather, paternal grandmother, and paternal uncle; Diabetes in her maternal grandfather, maternal grandmother, and paternal grandfather; GER disease in her paternal uncle; Heart attack in her maternal grandfather; Hypertension in her maternal grandfather, paternal aunt, paternal grandfather, and paternal uncle; Osteoporosis in her maternal grandmother; Von Hippel-Lindau syndrome in her brother. She  reports that she has never smoked. She has never used smokeless tobacco. She reports current alcohol use. She reports  that she does not use drugs. She has a current medication list which includes the following prescription(s): hydrocortisone, lo loestrin fe, and triamcinolone cream. She is allergic to other.       Review of Systems:  Review of Systems  Constitutional: Denied constitutional symptoms, night sweats, recent illness, fatigue, fever, insomnia and weight loss.  Eyes: Denied eye symptoms, eye pain, photophobia, vision change and visual disturbance.  Ears/Nose/Throat/Neck: Denied ear, nose, throat or neck symptoms, hearing loss, nasal discharge, sinus congestion and sore throat.  Cardiovascular: Denied cardiovascular symptoms, arrhythmia, chest pain/pressure, edema, exercise intolerance, orthopnea and palpitations.  Respiratory: Denied pulmonary symptoms, asthma, pleuritic pain, productive sputum, cough, dyspnea and wheezing.  Gastrointestinal: Denied, gastro-esophageal reflux, melena, nausea and vomiting.  Genitourinary: Denied genitourinary symptoms including symptomatic vaginal discharge, pelvic relaxation issues, and urinary complaints.  Musculoskeletal: Denied musculoskeletal symptoms, stiffness, swelling, muscle weakness and myalgia.  Dermatologic: Denied dermatology symptoms, rash and scar.  Neurologic: Denied neurology symptoms, dizziness, headache, neck pain and syncope.  Psychiatric: Denied psychiatric symptoms, anxiety and depression.  Endocrine: Denied endocrine symptoms including hot flashes and night sweats.   Meds:   Current Outpatient Medications on File Prior to Visit  Medication Sig Dispense Refill   hydrocortisone 2.5 % cream Apply to affected areas as directed 30 g 4   triamcinolone (KENALOG) 0.1 % Apply to affected areas as directed. 80 g 4   No current facility-administered medications on file prior to visit.     Objective:     Vitals:   08/07/21 1345  BP: 125/78  Pulse: 65  Resp: 16    Filed Weights   08/07/21 1345  Weight: 151 lb (68.5 kg)  Physical examination General NAD, Conversant  HEENT Atraumatic; Op clear with mmm.  Normo-cephalic. Pupils reactive. Anicteric sclerae  Thyroid/Neck Smooth without nodularity or enlargement. Normal ROM.  Neck Supple.  Skin No rashes, lesions or ulceration. Normal palpated skin turgor. No nodularity.  Breasts: No masses or discharge.  Symmetric.  No axillary adenopathy.  Lungs: Clear to auscultation.No rales or wheezes. Normal Respiratory effort, no retractions.  Heart: NSR.  No murmurs or rubs appreciated. No periferal edema  Abdomen: Soft.  Non-tender.  No masses.  No HSM. No hernia  Extremities: Moves all appropriately.  Normal ROM for age. No lymphadenopathy.  Neuro: Oriented to PPT.  Normal mood. Normal affect.     Pelvic:   Vulva: Normal appearance.  No lesions.  Vagina: No lesions or abnormalities noted.  Support: Normal pelvic support.  Urethra No masses tenderness or scarring.  Meatus Normal size without lesions or prolapse.  Cervix: Normal appearance.  No lesions.  Anus: Normal exam.  No lesions.  Perineum: Normal exam.  No lesions.        Bimanual   Uterus: Normal size.  Non-tender.  Mobile.  AV.  Adnexae: No masses.  Non-tender to palpation.  Cul-de-sac: Negative for abnormality.     Assessment:    No obstetric history on file. Patient Active Problem List   Diagnosis Date Noted   Family history of rheumatoid arthritis 11/17/2019   Rheumatoid factor positive 11/17/2019   Inflammatory arthritis 11/11/2019   Raynaud's disease without gangrene 11/11/2019   Arthralgia 10/18/2019   Vitamin D deficiency 10/18/2019   Fatigue 10/18/2019   Joint swelling 10/18/2019     1. Well woman exam with routine gynecological exam   2. Initiation of OCP (BCP)        Plan:            1.  Basic Screening Recommendations The basic screening recommendations for asymptomatic women were discussed with the patient during her visit.  The age-appropriate recommendations were  discussed with her and the rational for the tests reviewed.  When I am informed by the patient that another primary care physician has previously obtained the age-appropriate tests and they are up-to-date, only outstanding tests are ordered and referrals given as necessary.  Abnormal results of tests will be discussed with her when all of her results are completed.  Routine preventative health maintenance measures emphasized: Exercise/Diet/Weight control, Tobacco Warnings, Alcohol/Substance use risks and Stress Management 2.  We will start OCPs. OCPs The risks /benefits of OCPs have been explained to the patient in detail.  Product literature has been given to her where appropriate.  I have instructed her in the use of OCPs.  I have explained to the patient that OCPs are not as effective for birth control during the first month of use, and that another form of contraception should be used during this time.  Both first-day start and Sunday start have been explained.  The risks and benefits of each was discussed.  She has been made aware of  the fact that in rare circumstances, other medications may affect the efficacy of OCPs.  I have answered all of her questions, and I believe that she has an understanding of the effectiveness and use of OCPs.  Orders No orders of the defined types were placed in this encounter.    Meds ordered this encounter  Medications   Norethindrone-Ethinyl Estradiol-Fe Biphas (LO LOESTRIN FE) 1 MG-10 MCG / 10 MCG tablet    Sig: Take 1 tablet by mouth  at bedtime.    Dispense:  84 tablet    Refill:  3            F/U  Return in about 1 year (around 08/07/2022) for Annual Physical.  Finis Bud, M.D. 08/07/2021 2:01 PM

## 2021-08-16 DIAGNOSIS — F419 Anxiety disorder, unspecified: Secondary | ICD-10-CM | POA: Diagnosis not present

## 2021-09-11 DIAGNOSIS — F419 Anxiety disorder, unspecified: Secondary | ICD-10-CM | POA: Diagnosis not present

## 2021-09-25 DIAGNOSIS — F419 Anxiety disorder, unspecified: Secondary | ICD-10-CM | POA: Diagnosis not present

## 2021-10-09 DIAGNOSIS — F419 Anxiety disorder, unspecified: Secondary | ICD-10-CM | POA: Diagnosis not present

## 2021-10-16 ENCOUNTER — Telehealth: Payer: BC Managed Care – PPO | Admitting: Physician Assistant

## 2021-10-16 DIAGNOSIS — J4520 Mild intermittent asthma, uncomplicated: Secondary | ICD-10-CM | POA: Diagnosis not present

## 2021-10-16 MED ORDER — ALBUTEROL SULFATE HFA 108 (90 BASE) MCG/ACT IN AERS: 2.0000 | INHALATION_SPRAY | Freq: Four times a day (QID) | RESPIRATORY_TRACT | 0 refills | Status: AC | PRN

## 2021-10-16 NOTE — Patient Instructions (Signed)
°  [place], thank you for joining Leeanne Rio, PA-C for today's virtual visit.  While this provider is not your primary care provider (PCP), if your PCP is located in our provider database this encounter information will be shared with them immediately following your visit.  Consent: (Patient) Brandi Clayton provided verbal consent for this virtual visit at the beginning of the encounter.  Current Medications:  Current Outpatient Medications:    hydrocortisone 2.5 % cream, Apply to affected areas as directed, Disp: 30 g, Rfl: 4   norethindrone-ethinyl estradiol-FE (HAILEY FE 1/20) 1-20 MG-MCG tablet, Take 1 tablet by mouth daily., Disp: 84 tablet, Rfl: 3   triamcinolone (KENALOG) 0.1 %, Apply to affected areas as directed., Disp: 80 g, Rfl: 4   Medications ordered in this encounter:  No orders of the defined types were placed in this encounter.    *If you need refills on other medications prior to your next appointment, please contact your pharmacy*  Follow-Up: Call back or seek an in-person evaluation if the symptoms worsen or if the condition fails to improve as anticipated.  Other Instructions Use inhaler 10 minutes prior to starting your warm up for your runs. Make sure to schedule a follow-up with PCP.  If you note any new or worsening symptoms before follow-up, please be evaluated at nearest Urgent Care.   If you have been instructed to have an in-person evaluation today at a local Urgent Care facility, please use the link below. It will take you to a list of all of our available [facility], including address, phone number and hours of operation. Please do not delay care.  [facility]  If you or a family member do not have a primary care provider, use the link below to schedule a visit and establish care. When you choose a [facility] primary care physician or advanced practice provider, you gain a long-term partner in health. Find a  Primary Care Provider  Learn more about [facility]'s in-office and virtual care options: [facility]

## 2021-10-16 NOTE — Progress Notes (Signed)
Virtual Visit Consent   Brandi Clayton, you are scheduled for a virtual visit with a [facility] provider today.     Just as with appointments in the office, your consent must be obtained to participate.  Your consent will be active for this visit and any virtual visit you may have with one of our providers in the next 365 days.     If you have a MyChart account, a copy of this consent can be sent to you electronically.  All virtual visits are billed to your insurance company just like a traditional visit in the office.    As this is a virtual visit, video technology does not allow for your provider to perform a traditional examination.  This may limit your provider's ability to fully assess your condition.  If your provider identifies any concerns that need to be evaluated in person or the need to arrange testing (such as labs, EKG, etc.), we will make arrangements to do so.     Although advances in technology are sophisticated, we cannot ensure that it will always work on either your end or our end.  If the connection with a video visit is poor, the visit may have to be switched to a telephone visit.  With either a video or telephone visit, we are not always able to ensure that we have a secure connection.     I need to obtain your verbal consent now.   Are you willing to proceed with your visit today?    Brandi Clayton has provided verbal consent on 10/16/2021 for a virtual visit (video or telephone).   Leeanne Rio, [place]   Date: 10/16/2021 5:37 PM   Virtual Visit via Video Note   I, Leeanne Rio, connected with  Brandi Clayton  ([id number], 17-Jan-1991) on 10/16/21 at  5:30 PM EST by a video-enabled telemedicine application and verified that I am speaking with the correct person using two identifiers.  Location: Patient: Virtual Visit Location Patient: Home Provider: Virtual Visit Location Provider: Home Office   I discussed the limitations of evaluation and management by  telemedicine and the availability of in person appointments. The patient expressed understanding and agreed to proceed.    History of Present Illness: Brandi Clayton is a 31 y.o. who identifies as a female who was assigned female at birth, and is being seen today for possible exercise-induced asthma attacks. Notes history of this starting last Summer. Now happening about once a week to two weeks. Notes today while exercising she was noting symptoms. Was about 9-10 minutes into her running. Starts as a tightness in her lungs and then turns into some shortness of breath and wheezing. Sometimes a mild burning in the lungs. Has episodes of cough and clear congestion after this. Denies ever noting hives during exercise. Now lingering after her exercise for a bit. Is starting to note this happens more so when it is warmer than in colder weather.   HPI: HPI  Problems:  Patient Active Problem List   Diagnosis Date Noted   Family history of rheumatoid arthritis 11/17/2019   Rheumatoid factor positive 11/17/2019   Inflammatory arthritis 11/11/2019   Raynaud's disease without gangrene 11/11/2019   Arthralgia 10/18/2019   Vitamin D deficiency 10/18/2019   Fatigue 10/18/2019   Joint swelling 10/18/2019    Allergies:  Allergies  Allergen Reactions   Other Rash    Patient reports the Sun   Medications:  Current Outpatient Medications:    FLUoxetine (PROZAC) 20  MG capsule, , Disp: , Rfl:    albuterol (VENTOLIN HFA) 108 (90 Base) MCG/ACT inhaler, Inhale 2 puffs into the lungs every 6 (six) hours as needed for wheezing or shortness of breath., Disp: 8 g, Rfl: 0   hydrocortisone 2.5 % cream, Apply to affected areas as directed, Disp: 30 g, Rfl: 4   triamcinolone (KENALOG) 0.1 %, Apply to affected areas as directed., Disp: 80 g, Rfl: 4  Observations/Objective: Patient is well-developed, well-nourished in no acute distress.  Resting comfortably at home.  Head is normocephalic, atraumatic.  No labored  breathing. Speech is clear and coherent with logical content.  Patient is alert and oriented at baseline.   Assessment and Plan: 1. Mild intermittent reactive airway disease without complication - albuterol (VENTOLIN HFA) 108 (90 Base) MCG/ACT inhaler; Inhale 2 puffs into the lungs every 6 (six) hours as needed for wheezing or shortness of breath.  Dispense: 8 g; Refill: 0  Possible true exercise-induced asthma or combination of this an allergic asthma. Think it is ok to start a trial of inhaler prior to runs. She needs to schedule follow-up with PCP for ongoing evaluation and management. Strict ER precautions reviewed.   Follow Up Instructions: I discussed the assessment and treatment plan with the patient. The patient was provided an opportunity to ask questions and all were answered. The patient agreed with the plan and demonstrated an understanding of the instructions.  A copy of instructions were sent to the patient via MyChart unless otherwise noted below.   The patient was advised to call back or seek an in-person evaluation if the symptoms worsen or if the condition fails to improve as anticipated.  Time:  I spent 10 minutes with the patient via telehealth technology discussing the above problems/concerns.    Leeanne Rio, PA-C

## 2021-10-23 DIAGNOSIS — F419 Anxiety disorder, unspecified: Secondary | ICD-10-CM | POA: Diagnosis not present

## 2021-11-06 DIAGNOSIS — F419 Anxiety disorder, unspecified: Secondary | ICD-10-CM | POA: Diagnosis not present

## 2021-11-20 DIAGNOSIS — F419 Anxiety disorder, unspecified: Secondary | ICD-10-CM | POA: Diagnosis not present

## 2021-11-29 IMAGING — CR DG HAND COMPLETE 3+V*R*
1 series · 3 of 3 positions shown · non-contrast
Comparison: None.

CLINICAL DATA: Chronic pain and swelling

EXAM:
RIGHT HAND - COMPLETE 3+ VIEW

[Series 1: dg hand complete right · 0.14mm/px · 3 of 3 slices shown]
[im 1/3]
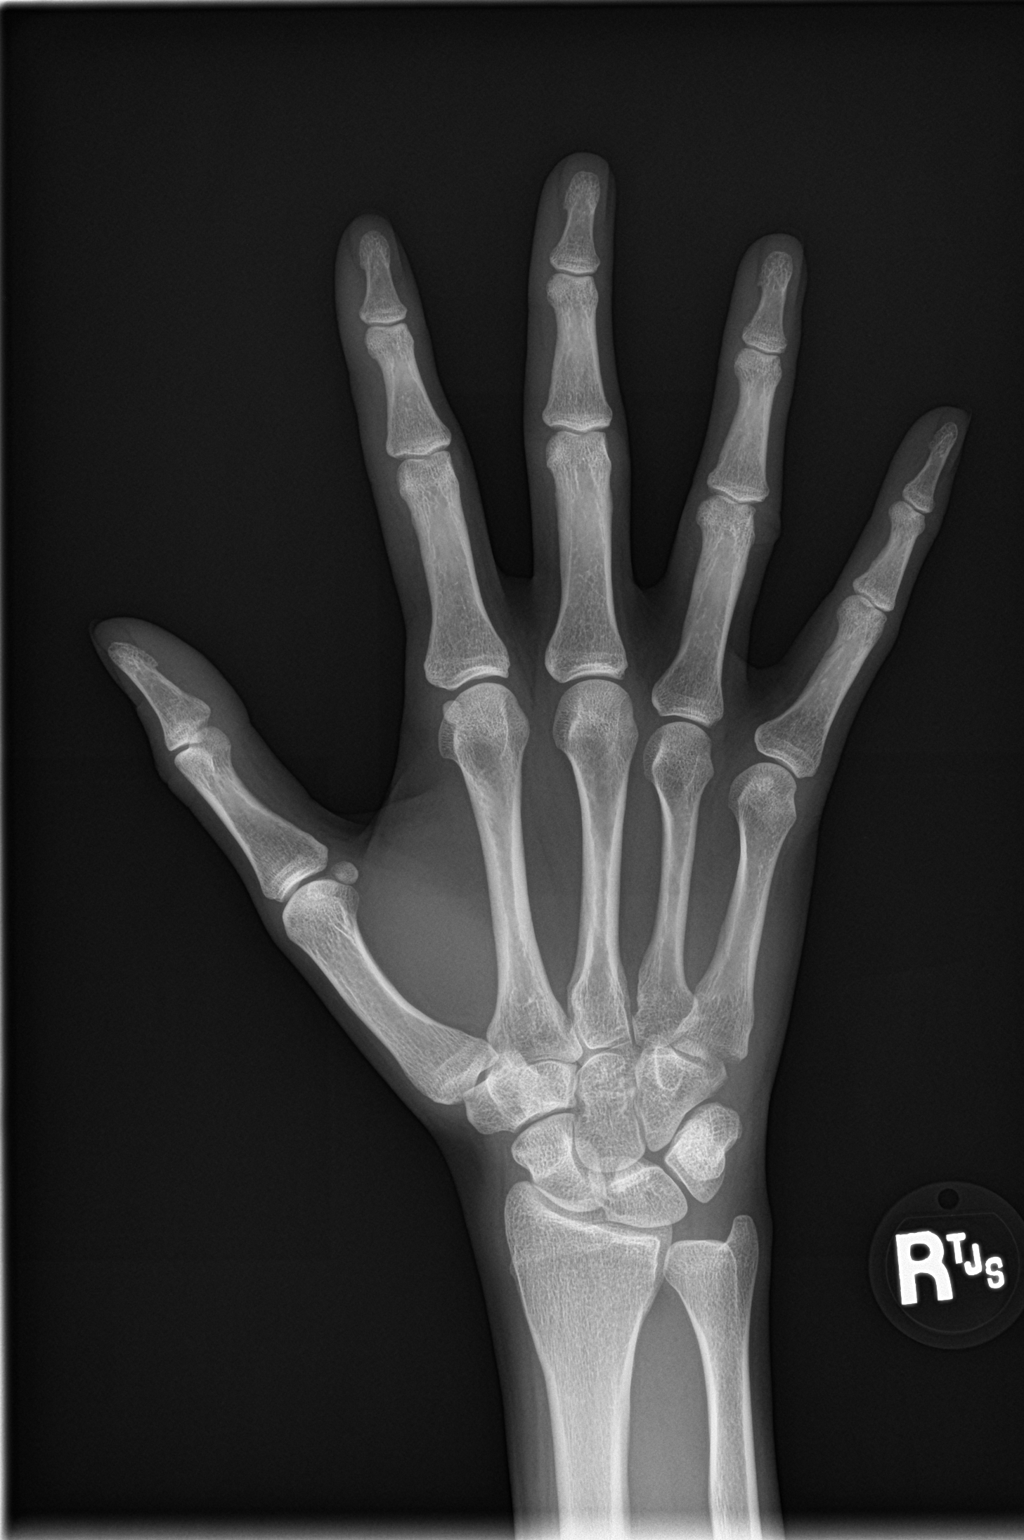
[im 2/3]
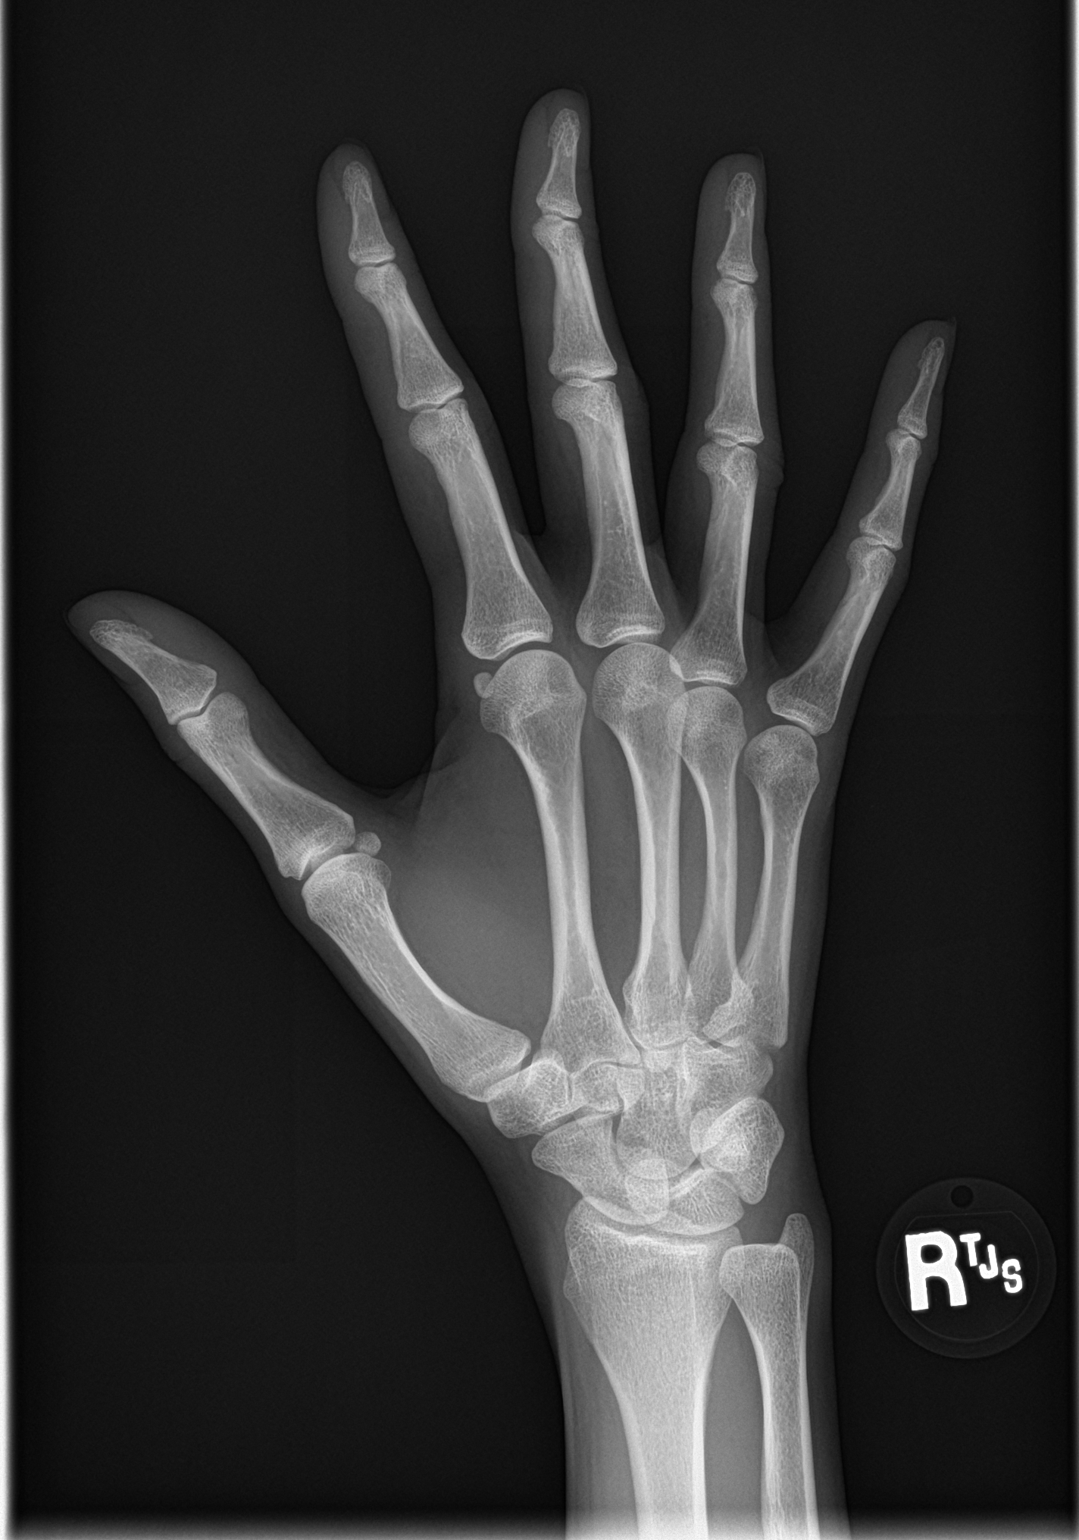
[im 3/3]
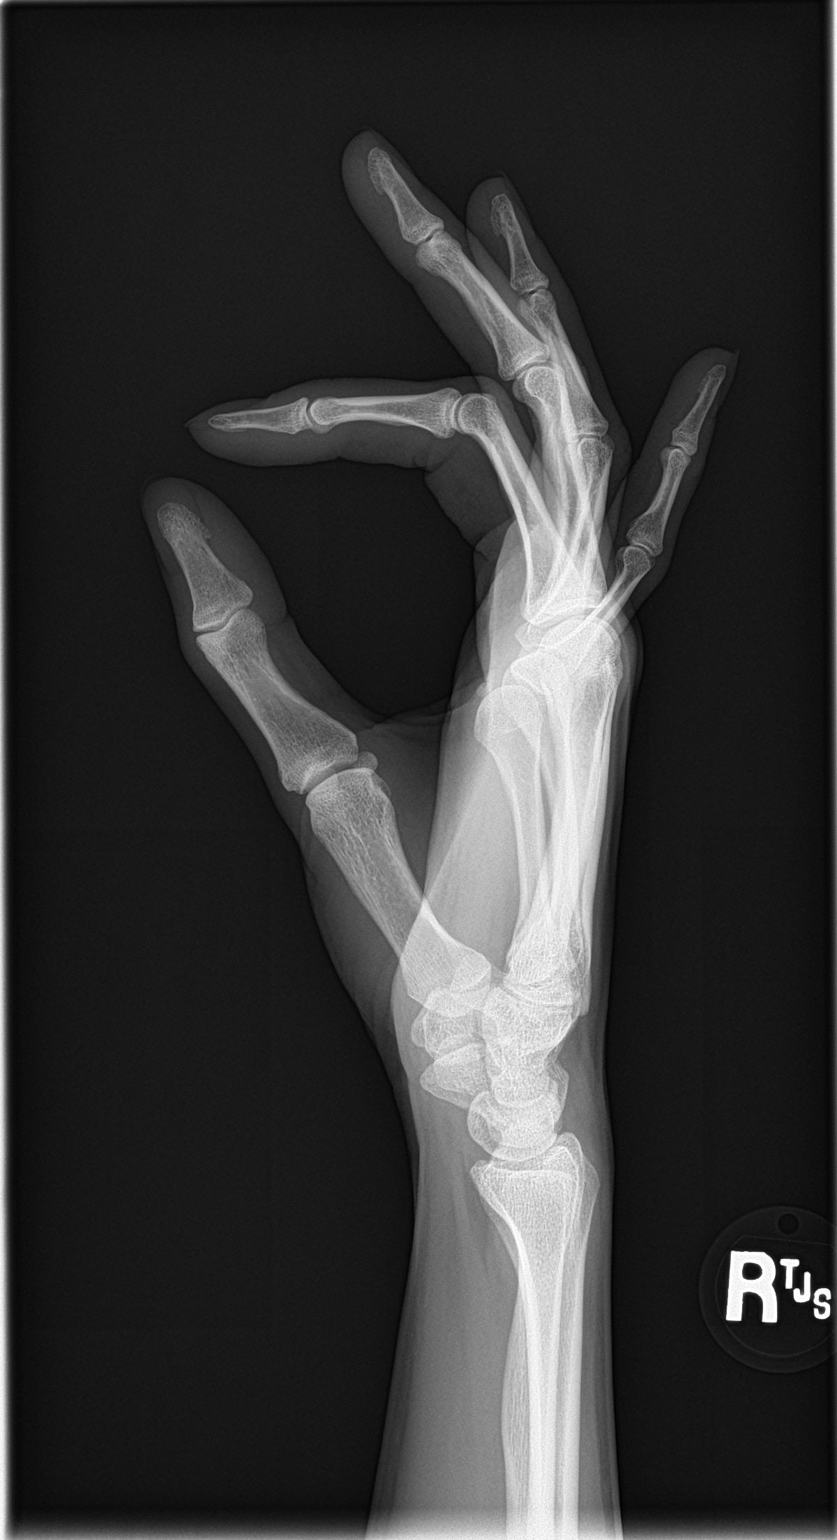

[3 of 3 positions shown; findings below may reference images not displayed]

FINDINGS: Frontal, oblique, and lateral views obtained. Bony mineralization is
normal. No fracture or dislocation. Joint spaces appear normal. No
erosive change or periostitis.
IMPRESSION: No evident arthropathy.  No fracture or dislocation.

## 2021-12-04 DIAGNOSIS — F419 Anxiety disorder, unspecified: Secondary | ICD-10-CM | POA: Diagnosis not present

## 2021-12-18 DIAGNOSIS — F419 Anxiety disorder, unspecified: Secondary | ICD-10-CM | POA: Diagnosis not present

## 2022-01-03 DIAGNOSIS — F419 Anxiety disorder, unspecified: Secondary | ICD-10-CM | POA: Diagnosis not present

## 2022-01-15 DIAGNOSIS — F419 Anxiety disorder, unspecified: Secondary | ICD-10-CM | POA: Diagnosis not present

## 2022-01-29 DIAGNOSIS — F419 Anxiety disorder, unspecified: Secondary | ICD-10-CM | POA: Diagnosis not present

## 2022-02-12 DIAGNOSIS — F419 Anxiety disorder, unspecified: Secondary | ICD-10-CM | POA: Diagnosis not present

## 2022-02-26 DIAGNOSIS — F419 Anxiety disorder, unspecified: Secondary | ICD-10-CM | POA: Diagnosis not present

## 2022-03-12 DIAGNOSIS — F419 Anxiety disorder, unspecified: Secondary | ICD-10-CM | POA: Diagnosis not present

## 2022-03-26 DIAGNOSIS — F419 Anxiety disorder, unspecified: Secondary | ICD-10-CM | POA: Diagnosis not present

## 2022-04-09 DIAGNOSIS — F419 Anxiety disorder, unspecified: Secondary | ICD-10-CM | POA: Diagnosis not present

## 2022-04-23 DIAGNOSIS — F419 Anxiety disorder, unspecified: Secondary | ICD-10-CM | POA: Diagnosis not present

## 2022-05-10 DIAGNOSIS — F419 Anxiety disorder, unspecified: Secondary | ICD-10-CM | POA: Diagnosis not present

## 2022-05-21 DIAGNOSIS — F419 Anxiety disorder, unspecified: Secondary | ICD-10-CM | POA: Diagnosis not present

## 2022-06-18 DIAGNOSIS — F419 Anxiety disorder, unspecified: Secondary | ICD-10-CM | POA: Diagnosis not present

## 2022-07-02 DIAGNOSIS — F419 Anxiety disorder, unspecified: Secondary | ICD-10-CM | POA: Diagnosis not present

## 2022-07-03 ENCOUNTER — Ambulatory Visit (INDEPENDENT_AMBULATORY_CARE_PROVIDER_SITE_OTHER): Payer: BC Managed Care – PPO | Admitting: Podiatry

## 2022-07-03 DIAGNOSIS — L600 Ingrowing nail: Secondary | ICD-10-CM | POA: Diagnosis not present

## 2022-07-03 MED ORDER — NEOMYCIN-POLYMYXIN-HC 3.5-10000-1 OT SUSP: OTIC | 0 refills | Status: AC

## 2022-07-03 NOTE — Progress Notes (Signed)
  Subjective:  Patient ID: Brandi Clayton, female    DOB: 1991-08-16,  MRN: [id number]  Chief Complaint  Patient presents with   Ingrown Toenail      np hx of ingrown toenails/ right great toenail ingrown/ interested in permanent removal    31 y.o. female presents with the above complaint. History confirmed with patient.   Objective:  Physical Exam: warm, good capillary refill, no trophic changes or ulcerative lesions, normal DP and PT pulses, normal sensory exam, and ingrowing nails with pain medial and lateral border right hallux.  Assessment:   1. Ingrowing right great toenail      Plan:  Patient was evaluated and treated and all questions answered.    Ingrown Nail, right -Patient elects to proceed with minor surgery to remove ingrown toenail today. Consent reviewed and signed by patient. -Ingrown nail excised. See procedure note. -Educated on post-procedure care including soaking. Written instructions provided and reviewed.  Procedure: Excision of Ingrown Toenail Location: Right 1st toe  medial and lateral  nail borders. Anesthesia: Lidocaine 1% plain; 1.5 mL and Marcaine 0.5% plain; 1.5 mL, digital block. Skin Prep: Betadine. Dressing: Silvadene; telfa; dry, sterile, compression dressing. Technique: Following skin prep, the toe was exsanguinated and a tourniquet was secured at the base of the toe. The affected nail border was freed, split with a nail splitter, and excised. Chemical matrixectomy was then performed with phenol and irrigated out with alcohol. The tourniquet was then removed and sterile dressing applied. Disposition: Patient tolerated procedure well.    No follow-ups on file.

## 2022-07-03 NOTE — Patient Instructions (Signed)

## 2022-07-16 DIAGNOSIS — F419 Anxiety disorder, unspecified: Secondary | ICD-10-CM | POA: Diagnosis not present

## 2022-07-30 DIAGNOSIS — F419 Anxiety disorder, unspecified: Secondary | ICD-10-CM | POA: Diagnosis not present

## 2022-08-13 DIAGNOSIS — F419 Anxiety disorder, unspecified: Secondary | ICD-10-CM | POA: Diagnosis not present

## 2022-09-17 DIAGNOSIS — F419 Anxiety disorder, unspecified: Secondary | ICD-10-CM | POA: Diagnosis not present

## 2022-10-01 DIAGNOSIS — F419 Anxiety disorder, unspecified: Secondary | ICD-10-CM | POA: Diagnosis not present

## 2022-10-15 DIAGNOSIS — F419 Anxiety disorder, unspecified: Secondary | ICD-10-CM | POA: Diagnosis not present

## 2022-11-12 DIAGNOSIS — F419 Anxiety disorder, unspecified: Secondary | ICD-10-CM | POA: Diagnosis not present

## 2022-11-13 DIAGNOSIS — F4310 Post-traumatic stress disorder, unspecified: Secondary | ICD-10-CM | POA: Insufficient documentation

## 2022-11-13 DIAGNOSIS — F411 Generalized anxiety disorder: Secondary | ICD-10-CM | POA: Diagnosis not present

## 2022-11-13 DIAGNOSIS — Z8659 Personal history of other mental and behavioral disorders: Secondary | ICD-10-CM | POA: Diagnosis not present

## 2022-11-13 DIAGNOSIS — Z130 Encounter for screening for diseases of the blood and blood-forming organs and certain disorders involving the immune mechanism: Secondary | ICD-10-CM | POA: Diagnosis not present

## 2022-11-13 DIAGNOSIS — Z23 Encounter for immunization: Secondary | ICD-10-CM | POA: Diagnosis not present

## 2022-11-19 DIAGNOSIS — Z1159 Encounter for screening for other viral diseases: Secondary | ICD-10-CM | POA: Diagnosis not present

## 2022-11-19 DIAGNOSIS — Z130 Encounter for screening for diseases of the blood and blood-forming organs and certain disorders involving the immune mechanism: Secondary | ICD-10-CM | POA: Diagnosis not present

## 2022-11-19 DIAGNOSIS — F4310 Post-traumatic stress disorder, unspecified: Secondary | ICD-10-CM | POA: Diagnosis not present

## 2022-11-19 DIAGNOSIS — Z114 Encounter for screening for human immunodeficiency virus [HIV]: Secondary | ICD-10-CM | POA: Diagnosis not present

## 2022-11-19 DIAGNOSIS — Z8659 Personal history of other mental and behavioral disorders: Secondary | ICD-10-CM | POA: Diagnosis not present

## 2022-11-19 DIAGNOSIS — Z13228 Encounter for screening for other metabolic disorders: Secondary | ICD-10-CM | POA: Diagnosis not present

## 2022-11-19 DIAGNOSIS — Z136 Encounter for screening for cardiovascular disorders: Secondary | ICD-10-CM | POA: Diagnosis not present

## 2022-11-19 DIAGNOSIS — F411 Generalized anxiety disorder: Secondary | ICD-10-CM | POA: Diagnosis not present

## 2022-11-19 DIAGNOSIS — Z131 Encounter for screening for diabetes mellitus: Secondary | ICD-10-CM | POA: Diagnosis not present

## 2022-12-10 DIAGNOSIS — F419 Anxiety disorder, unspecified: Secondary | ICD-10-CM | POA: Diagnosis not present

## 2022-12-17 ENCOUNTER — Ambulatory Visit: Payer: BC Managed Care – PPO | Admitting: Gastroenterology

## 2022-12-20 DIAGNOSIS — F4310 Post-traumatic stress disorder, unspecified: Secondary | ICD-10-CM | POA: Diagnosis not present

## 2022-12-20 DIAGNOSIS — F411 Generalized anxiety disorder: Secondary | ICD-10-CM | POA: Diagnosis not present

## 2022-12-20 DIAGNOSIS — Z0000 Encounter for general adult medical examination without abnormal findings: Secondary | ICD-10-CM | POA: Diagnosis not present

## 2022-12-27 DIAGNOSIS — R42 Dizziness and giddiness: Secondary | ICD-10-CM | POA: Diagnosis not present

## 2023-01-01 DIAGNOSIS — F419 Anxiety disorder, unspecified: Secondary | ICD-10-CM | POA: Diagnosis not present

## 2023-01-21 DIAGNOSIS — F419 Anxiety disorder, unspecified: Secondary | ICD-10-CM | POA: Diagnosis not present

## 2023-04-08 DIAGNOSIS — F419 Anxiety disorder, unspecified: Secondary | ICD-10-CM | POA: Diagnosis not present

## 2023-04-22 DIAGNOSIS — F419 Anxiety disorder, unspecified: Secondary | ICD-10-CM | POA: Diagnosis not present

## 2023-05-06 DIAGNOSIS — F419 Anxiety disorder, unspecified: Secondary | ICD-10-CM | POA: Diagnosis not present

## 2023-05-20 DIAGNOSIS — F419 Anxiety disorder, unspecified: Secondary | ICD-10-CM | POA: Diagnosis not present

## 2023-06-03 DIAGNOSIS — F419 Anxiety disorder, unspecified: Secondary | ICD-10-CM | POA: Diagnosis not present

## 2023-06-17 DIAGNOSIS — F419 Anxiety disorder, unspecified: Secondary | ICD-10-CM | POA: Diagnosis not present

## 2023-07-04 DIAGNOSIS — B360 Pityriasis versicolor: Secondary | ICD-10-CM | POA: Diagnosis not present

## 2023-07-04 DIAGNOSIS — L812 Freckles: Secondary | ICD-10-CM | POA: Diagnosis not present

## 2023-07-04 DIAGNOSIS — D2239 Melanocytic nevi of other parts of face: Secondary | ICD-10-CM | POA: Diagnosis not present

## 2023-07-04 DIAGNOSIS — Z1283 Encounter for screening for malignant neoplasm of skin: Secondary | ICD-10-CM | POA: Diagnosis not present

## 2023-07-22 DIAGNOSIS — F419 Anxiety disorder, unspecified: Secondary | ICD-10-CM | POA: Diagnosis not present

## 2023-08-05 DIAGNOSIS — F419 Anxiety disorder, unspecified: Secondary | ICD-10-CM | POA: Diagnosis not present

## 2023-08-21 DIAGNOSIS — F419 Anxiety disorder, unspecified: Secondary | ICD-10-CM | POA: Diagnosis not present

## 2023-09-09 DIAGNOSIS — F419 Anxiety disorder, unspecified: Secondary | ICD-10-CM | POA: Diagnosis not present

## 2023-09-17 DIAGNOSIS — B360 Pityriasis versicolor: Secondary | ICD-10-CM | POA: Diagnosis not present

## 2023-09-17 DIAGNOSIS — L218 Other seborrheic dermatitis: Secondary | ICD-10-CM | POA: Diagnosis not present

## 2023-09-17 DIAGNOSIS — L700 Acne vulgaris: Secondary | ICD-10-CM | POA: Diagnosis not present

## 2023-09-23 DIAGNOSIS — F419 Anxiety disorder, unspecified: Secondary | ICD-10-CM | POA: Diagnosis not present

## 2023-10-07 DIAGNOSIS — F419 Anxiety disorder, unspecified: Secondary | ICD-10-CM | POA: Diagnosis not present

## 2023-10-10 ENCOUNTER — Telehealth: Payer: Self-pay

## 2023-10-10 NOTE — Telephone Encounter (Signed)
 Copied from CRM [id number]. Topic: Appointments - Scheduling Inquiry for Clinic >> Oct 10, 2023 10:14 AM [place] J wrote: Reason for CRM: Patient is wanting to schedule for labs to be done, but isn't in a rush.

## 2023-10-20 NOTE — Telephone Encounter (Signed)
 Patient is scheduled to establish care in April.  If she needs labs before that she needs to reschedule appointment.

## 2023-10-30 ENCOUNTER — Encounter: Payer: Self-pay | Admitting: Obstetrics and Gynecology

## 2023-10-30 ENCOUNTER — Ambulatory Visit (INDEPENDENT_AMBULATORY_CARE_PROVIDER_SITE_OTHER): Payer: BC Managed Care – PPO | Admitting: Obstetrics and Gynecology

## 2023-10-30 ENCOUNTER — Other Ambulatory Visit (HOSPITAL_COMMUNITY)
Admission: RE | Admit: 2023-10-30 | Discharge: 2023-10-30 | Disposition: A | Discharge status: Home / Self Care | Source: Ambulatory Visit | Attending: Obstetrics and Gynecology | Admitting: Obstetrics and Gynecology

## 2023-10-30 VITALS — BP 120/78 | HR 61 | Ht 67.5 in | Wt 141.3 lb

## 2023-10-30 DIAGNOSIS — Z01419 Encounter for gynecological examination (general) (routine) without abnormal findings: Secondary | ICD-10-CM | POA: Diagnosis not present

## 2023-10-30 DIAGNOSIS — Z124 Encounter for screening for malignant neoplasm of cervix: Secondary | ICD-10-CM | POA: Insufficient documentation

## 2023-10-30 NOTE — Progress Notes (Signed)
 Patients presents for annual exam today. She states regular monthly cycles, tracks cycle for pregnancy prevention. Due for pap smear, ordered. She states no other questions or concerns at this time.

## 2023-10-30 NOTE — Progress Notes (Signed)
 HPI:      [name] is a 33 y.o. G1P0010 who LMP was Patient's last menstrual period was 10/05/2023.  Subjective:   She presents today for her annual examination.  She has no complaints.  She is having normal regular cycles and using cycle tracking for birth control.  She desires no other method at this time. She is due for Pap smear.    Hx: The following portions of the patient's history were reviewed and updated as appropriate:             She  has a past medical history of Allergy, Arthritis, and Eczema. She does not have any pertinent problems on file. She  has no past surgical history on file. Her family history includes Alcohol abuse in her paternal grandfather, paternal grandmother, and paternal uncle; Alzheimer's disease in her paternal grandmother; Anemia in her paternal aunt; Anxiety disorder in her maternal grandmother and paternal uncle; Arthritis in her father, maternal grandfather, maternal grandmother, mother, and paternal uncle; Asthma in her brother and paternal grandmother; COPD in her maternal grandmother and paternal grandmother; Cancer in her maternal grandmother and paternal grandmother; Cataracts in her maternal grandfather, maternal grandmother, and paternal grandmother; Cirrhosis in her paternal aunt; Depression in her brother, maternal grandmother, paternal aunt, paternal grandfather, paternal grandmother, and paternal uncle; Diabetes in her maternal grandfather, maternal grandmother, and paternal grandfather; GER disease in her paternal uncle; Heart attack in her maternal grandfather; Hypertension in her maternal grandfather, paternal aunt, paternal grandfather, and paternal uncle; Osteoporosis in her maternal grandmother; Von Hippel-Lindau syndrome in her brother. She  reports that she has never smoked. She has never used smokeless tobacco. She reports that she does not currently use alcohol. She reports that she does not use drugs. She has a current medication  list which includes the following prescription(s): albuterol, clindamycin, hydrocortisone, ketoconazole (topical), neomycin-polymyxin-hydrocortisone, pyrithione zinc, sulfacetamide sodium-sulfur, tretinoin, and triamcinolone cream. She is allergic to other.       Review of Systems:  Review of Systems  Constitutional: Denied constitutional symptoms, night sweats, recent illness, fatigue, fever, insomnia and weight loss.  Eyes: Denied eye symptoms, eye pain, photophobia, vision change and visual disturbance.  Ears/Nose/Throat/Neck: Denied ear, nose, throat or neck symptoms, hearing loss, nasal discharge, sinus congestion and sore throat.  Cardiovascular: Denied cardiovascular symptoms, arrhythmia, chest pain/pressure, edema, exercise intolerance, orthopnea and palpitations.  Respiratory: Denied pulmonary symptoms, asthma, pleuritic pain, productive sputum, cough, dyspnea and wheezing.  Gastrointestinal: Denied, gastro-esophageal reflux, melena, nausea and vomiting.  Genitourinary: Denied genitourinary symptoms including symptomatic vaginal discharge, pelvic relaxation issues, and urinary complaints.  Musculoskeletal: Denied musculoskeletal symptoms, stiffness, swelling, muscle weakness and myalgia.  Dermatologic: Denied dermatology symptoms, rash and scar.  Neurologic: Denied neurology symptoms, dizziness, headache, neck pain and syncope.  Psychiatric: Denied psychiatric symptoms, anxiety and depression.  Endocrine: Denied endocrine symptoms including hot flashes and night sweats.   Meds:   Current Outpatient Medications on File Prior to Visit  Medication Sig Dispense Refill   albuterol (VENTOLIN HFA) 108 (90 Base) MCG/ACT inhaler Inhale 2 puffs into the lungs every 6 (six) hours as needed for wheezing or shortness of breath. 8 g 0   clindamycin (CLEOCIN T) 1 % lotion      hydrocortisone 2.5 % cream Apply to affected areas as directed 30 g 4   KETOCONAZOLE, TOPICAL, 1 % SHAM       neomycin-polymyxin-hydrocortisone (CORTISPORIN) 3.5-10000-1 OTIC suspension Apply 1-2 drops daily after soaking and cover with bandaid 10 mL 0  Pyrithione Zinc 0.25 % LIQD      Sulfacetamide Sodium-Sulfur 8-4 % SUSP      tretinoin (RETIN-A) 0.025 % cream      triamcinolone (KENALOG) 0.1 % Apply to affected areas as directed. 80 g 4   No current facility-administered medications on file prior to visit.     Objective:     Vitals:   10/30/23 0910  BP: 120/78  Pulse: 61    Filed Weights   10/30/23 0910  Weight: 141 lb 4.8 oz (64.1 kg)              Physical examination General NAD, Conversant  HEENT Atraumatic; Op clear with mmm.  Normo-cephalic.  Anicteric sclerae  Thyroid/Neck Smooth without nodularity or enlargement. Normal ROM.  Neck Supple.  Skin No rashes, lesions or ulceration. Normal palpated skin turgor. No nodularity.  Breasts: No masses or discharge.  Symmetric.  No axillary adenopathy.  Lungs: Clear to auscultation.No rales or wheezes. Normal Respiratory effort, no retractions.  Heart: NSR.  No murmurs or rubs appreciated. No peripheral edema  Abdomen: Soft.  Non-tender.  No masses.  No HSM. No hernia  Extremities: Moves all appropriately.  Normal ROM for age. No lymphadenopathy.  Neuro: Oriented to PPT.  Normal mood. Normal affect.     Pelvic:   Vulva: Normal appearance.  No lesions.  Vagina: No lesions or abnormalities noted.  Support: Normal pelvic support.  Urethra No masses tenderness or scarring.  Meatus Normal size without lesions or prolapse.  Cervix: Normal appearance.  No lesions.  Anus: Normal exam.  No lesions.  Perineum: Normal exam.  No lesions.        Bimanual   Uterus: Normal size.  Non-tender.  Mobile.  AV.  Adnexae: No masses.  Non-tender to palpation.  Cul-de-sac: Negative for abnormality.     Assessment:    G1P0010 Patient Active Problem List   Diagnosis Date Noted   Family history of rheumatoid arthritis 11/17/2019   Rheumatoid  factor positive 11/17/2019   Inflammatory arthritis 11/11/2019   Raynaud's disease without gangrene 11/11/2019   Arthralgia 10/18/2019   Vitamin D deficiency 10/18/2019   Fatigue 10/18/2019   Joint swelling 10/18/2019     1. Well woman exam with routine gynecological exam   2. Cervical cancer screening     Normal exam-no problems   Plan:            1.  Basic Screening Recommendations The basic screening recommendations for asymptomatic women were discussed with the patient during her visit.  The age-appropriate recommendations were discussed with her and the rational for the tests reviewed.  When I am informed by the patient that another primary care physician has previously obtained the age-appropriate tests and they are up-to-date, only outstanding tests are ordered and referrals given as necessary.  Abnormal results of tests will be discussed with her when all of her results are completed.  Routine preventative health maintenance measures emphasized: Exercise/Diet/Weight control, Tobacco Warnings, Alcohol/Substance use risks and Stress Management Pap performed Orders No orders of the defined types were placed in this encounter.   No orders of the defined types were placed in this encounter.         F/U  No follow-ups on file.  Elonda Husky, M.D. 10/30/2023 9:23 AM

## 2023-11-04 LAB — CYTOLOGY - PAP
Comment: NEGATIVE
Diagnosis: NEGATIVE
High risk HPV: NEGATIVE

## 2023-11-18 DIAGNOSIS — F419 Anxiety disorder, unspecified: Secondary | ICD-10-CM | POA: Diagnosis not present

## 2023-11-25 ENCOUNTER — Ambulatory Visit (INDEPENDENT_AMBULATORY_CARE_PROVIDER_SITE_OTHER): Admitting: Nurse Practitioner

## 2023-11-25 ENCOUNTER — Encounter: Payer: Self-pay | Admitting: Nurse Practitioner

## 2023-11-25 VITALS — BP 120/72 | HR 71 | Temp 98.1°F | Ht 67.5 in | Wt 144.8 lb

## 2023-11-25 DIAGNOSIS — Z114 Encounter for screening for human immunodeficiency virus [HIV]: Secondary | ICD-10-CM | POA: Diagnosis not present

## 2023-11-25 DIAGNOSIS — E785 Hyperlipidemia, unspecified: Secondary | ICD-10-CM | POA: Diagnosis not present

## 2023-11-25 DIAGNOSIS — E559 Vitamin D deficiency, unspecified: Secondary | ICD-10-CM | POA: Diagnosis not present

## 2023-11-25 DIAGNOSIS — J302 Other seasonal allergic rhinitis: Secondary | ICD-10-CM | POA: Diagnosis not present

## 2023-11-25 DIAGNOSIS — F32A Depression, unspecified: Secondary | ICD-10-CM | POA: Insufficient documentation

## 2023-11-25 DIAGNOSIS — Z7689 Persons encountering health services in other specified circumstances: Secondary | ICD-10-CM

## 2023-11-25 DIAGNOSIS — J4520 Mild intermittent asthma, uncomplicated: Secondary | ICD-10-CM

## 2023-11-25 DIAGNOSIS — R768 Other specified abnormal immunological findings in serum: Secondary | ICD-10-CM | POA: Diagnosis not present

## 2023-11-25 DIAGNOSIS — I7300 Raynaud's syndrome without gangrene: Secondary | ICD-10-CM

## 2023-11-25 DIAGNOSIS — M533 Sacrococcygeal disorders, not elsewhere classified: Secondary | ICD-10-CM

## 2023-11-25 LAB — CBC WITH DIFFERENTIAL/PLATELET
Basophils Absolute: 0 10*3/uL (ref 0.0–0.1)
Basophils Relative: 0.5 % (ref 0.0–3.0)
Eosinophils Absolute: 0.1 10*3/uL (ref 0.0–0.7)
Eosinophils Relative: 1.3 % (ref 0.0–5.0)
HCT: 39.6 % (ref 36.0–46.0)
Hemoglobin: 13.3 g/dL (ref 12.0–15.0)
Lymphocytes Relative: 34.1 % (ref 12.0–46.0)
Lymphs Abs: 2.2 10*3/uL (ref 0.7–4.0)
MCHC: 33.5 g/dL (ref 30.0–36.0)
MCV: 88.7 fl (ref 78.0–100.0)
Monocytes Absolute: 0.9 10*3/uL (ref 0.1–1.0)
Monocytes Relative: 13.7 % — ABNORMAL HIGH (ref 3.0–12.0)
Neutro Abs: 3.2 10*3/uL (ref 1.4–7.7)
Neutrophils Relative %: 50.4 % (ref 43.0–77.0)
Platelets: 291 10*3/uL (ref 150.0–400.0)
RBC: 4.46 Mil/uL (ref 3.87–5.11)
RDW: 12.4 % (ref 11.5–15.5)
WBC: 6.4 10*3/uL (ref 4.0–10.5)

## 2023-11-25 LAB — LIPID PANEL
Cholesterol: 160 mg/dL (ref 0–200)
HDL: 49.3 mg/dL (ref >39.00)
LDL Cholesterol: 98 mg/dL (ref 0–99)
NonHDL: 110.42
Total CHOL/HDL Ratio: 3
Triglycerides: 64 mg/dL (ref 0.0–149.0)
VLDL: 12.8 mg/dL (ref 0.0–40.0)

## 2023-11-25 LAB — COMPREHENSIVE METABOLIC PANEL
ALT: 11 U/L (ref 0–35)
AST: 17 U/L (ref 0–37)
Albumin: 4.4 g/dL (ref 3.5–5.2)
Alkaline Phosphatase: 67 U/L (ref 39–117)
BUN: 11 mg/dL (ref 6–23)
CO2: 30 meq/L (ref 19–32)
Calcium: 9.4 mg/dL (ref 8.4–10.5)
Chloride: 103 meq/L (ref 96–112)
Creatinine, Ser: 0.87 mg/dL (ref 0.40–1.20)
GFR: 88.53 mL/min (ref >60.00)
Glucose, Bld: 84 mg/dL (ref 70–99)
Potassium: 4.1 meq/L (ref 3.5–5.1)
Sodium: 140 meq/L (ref 135–145)
Total Bilirubin: 0.6 mg/dL (ref 0.2–1.2)
Total Protein: 7.1 g/dL (ref 6.0–8.3)

## 2023-11-25 LAB — VITAMIN D 25 HYDROXY (VIT D DEFICIENCY, FRACTURES): VITD: 20.12 ng/mL — ABNORMAL LOW (ref 30.00–100.00)

## 2023-11-25 LAB — TSH: TSH: 1.36 u[IU]/mL (ref 0.35–5.50)

## 2023-11-25 NOTE — Progress Notes (Signed)
 New Patient Office Visit  Subjective   Patient ID: Brandi Clayton, female    DOB: July 22, 1991  Age: 33 y.o. MRN: [id number]  CC:  Chief Complaint  Patient presents with   Establish Care    HPI Brandi Clayton presents to establish care.   Brandi Clayton is a 33 year old female who presents for establishing care. She is establishing care after her previous provider, Anna Genre, moved away.  She has a history of asthma, primarily triggered by allergies and exercise. She uses an albuterol inhaler as needed, particularly before cardiovascular activities like running, which she does three times a week. She has not seen a pulmonologist before.  She was diagnosed with rheumatoid arthritis in 2021 after a referral to rheumatology. She experiences flare-ups mostly in the winter, with generalized joint pain, particularly in her hands and feet, and occasionally in her knees. She also has Raynaud's phenomenon, which exacerbates her arthritis symptoms in the winter. She has not seen a rheumatologist since 2021 and is interested in re-evaluating her condition with blood work.  She recently experienced a tailbone injury after a fall from a [employer] bike, which aggravated a previous childhood injury. The pain is most significant when standing up from sitting and occasionally when bending over. She initially managed the pain with ibuprofen, but discontinued it due to heartburn.  She has a past history of depression and an eating disorder, both of which are in remission. She has been managing these conditions with therapy for the past seven to eight years and previously took fluoxetine for about a year. Her therapist is [employer]. No current medication for depression or eating disorder.  She reports a history of heartburn and GERD since childhood, which she manages with over-the-counter medications when necessary. Symptoms are often triggered by other medications.  She has seasonal allergies,  primarily in the spring, which have improved with age. She occasionally uses over-the-counter antihistamines like Claritin.  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 She seldom uses alcohol, does not use recreational drugs, and is sexually active using condoms.   She has a history of low vitamin D levels and has not been taking supplements recently. She also had an intermediate HIV antigen result in the past but did not follow up due to difficulties with blood draws.   Health Maintenance  Topic Date Due   Pneumococcal Vaccine 44-2 Years old (1 of 2 - PCV) Never done   Hepatitis C Screening  Never done   COVID-19 Vaccine (6 - 2024-25 season) 12/10/2023 (Originally 05/04/2023)   INFLUENZA VACCINE  04/02/2024   Cervical Cancer Screening (HPV/Pap Cotest)  10/29/2028   DTaP/Tdap/Td (3 - Td or Tdap) 11/12/2032   HPV VACCINES  Completed   HIV Screening  Completed    There are no preventive care reminders to display for this patient.  Outpatient Encounter Medications as of 11/25/2023  Medication Sig   albuterol (VENTOLIN HFA) 108 (90 Base) MCG/ACT inhaler Inhale 2 puffs into the lungs every 6 (six) hours as needed for wheezing or shortness of breath.   clindamycin (CLEOCIN T) 1 % lotion    hydrocortisone 2.5 % cream Apply to affected areas as directed   KETOCONAZOLE, TOPICAL, 1 % SHAM    neomycin-polymyxin-hydrocortisone (CORTISPORIN) 3.5-10000-1 OTIC suspension Apply 1-2 drops daily after soaking and cover with bandaid   Pyrithione Zinc 0.25 % LIQD    Sulfacetamide Sodium-Sulfur 8-4 % SUSP    tretinoin (RETIN-A) 0.025 % cream  triamcinolone (KENALOG) 0.1 % Apply to affected areas as directed.   No facility-administered encounter medications on file as of 11/25/2023.    Past Medical History:  Diagnosis Date   Allergy    Anxiety    Arthritis    Asthma    Depression    Eczema    GERD (gastroesophageal reflux disease)     History reviewed. No pertinent surgical  history.  Family History  Problem Relation Age of Onset   Hypertension Mother    Anxiety disorder Mother    Depression Mother    Arthritis Father    Rheum arthritis Father    Anxiety disorder Father    Hyperlipidemia Father    Depression Brother    Asthma Brother    Von Hippel-Lindau syndrome Brother    ADD / ADHD Brother    Drug abuse Brother    Asthma Brother    Depression Brother    Depression Paternal Aunt    Anemia Paternal Aunt    Cirrhosis Paternal Aunt    Hypertension Paternal Aunt    Hypertension Paternal Uncle    Anxiety disorder Paternal Uncle    Depression Paternal Uncle    Alcohol abuse Paternal Uncle    GER disease Paternal Uncle    Arthritis Paternal Uncle    Cancer Maternal Grandmother        lung cancer   Arthritis Maternal Grandmother    COPD Maternal Grandmother    Anxiety disorder Maternal Grandmother    Depression Maternal Grandmother    Cataracts Maternal Grandmother    Diabetes Maternal Grandmother    Osteoporosis Maternal Grandmother    Diabetes Maternal Grandfather    Heart attack Maternal Grandfather    Hypertension Maternal Grandfather    Arthritis Maternal Grandfather    Cataracts Maternal Grandfather    Early death Maternal Grandfather 71       heart disease   Heart disease Maternal Grandfather    COPD Paternal Grandmother    Asthma Paternal Grandmother    Depression Paternal Grandmother    Cancer Paternal Grandmother        breast cancer   Cataracts Paternal Grandmother    Alzheimer's disease Paternal Grandmother    Alcohol abuse Paternal Grandmother    Arthritis Paternal Grandmother    Drug abuse Paternal Grandmother    Diabetes Paternal Grandfather    Depression Paternal Grandfather    Hypertension Paternal Grandfather    Alcohol abuse Paternal Grandfather     Social History   Socioeconomic History   Marital status: Married    Spouse name: Not on file   Number of children: Not on file   Years of education: Not on file    Highest education level: Bachelor's degree (e.g., BA, AB, BS)  Occupational History   Not on file  Tobacco Use   Smoking status: Never   Smokeless tobacco: Never  Vaping Use   Vaping status: Never Used  Substance and Sexual Activity   Alcohol use: Not Currently    Comment: seldom   Drug use: Yes   Sexual activity: Yes    Birth control/protection: Coitus interruptus, Condom, Other-see comments, None    Comment: Cycle tracking  Other Topics Concern   Not on file  Social History Narrative   Live with husband, has 1 cat.    Parent live in [place]    Social Drivers of Health   Financial Resource Strain: Low Risk  (11/24/2023)   Overall Financial Resource Strain (CARDIA)    Difficulty of  Paying Living Expenses: Not very hard  Food Insecurity: No Food Insecurity (11/24/2023)   Hunger Vital Sign    Worried About Running Out of Food in the Last Year: Never true    Ran Out of Food in the Last Year: Never true  Transportation Needs: No Transportation Needs (11/24/2023)   PRAPARE - Administrator, Civil Service (Medical): No    Lack of Transportation (Non-Medical): No  Physical Activity: Sufficiently Active (11/24/2023)   Exercise Vital Sign    Days of Exercise per Week: 6 days    Minutes of Exercise per Session: 40 min  Stress: Stress Concern Present (11/24/2023)   [employer] of Occupational Health - Occupational Stress Questionnaire    Feeling of Stress : To some extent  Social Connections: Moderately Isolated (11/24/2023)   Social Connection and Isolation Panel [NHANES]    Frequency of Communication with Friends and Family: Twice a week    Frequency of Social Gatherings with Friends and Family: Once a week    Attends Religious Services: Never    Database administrator or Organizations: No    Attends Engineer, structural: Not on file    Marital Status: Married  Catering manager Violence: Not on file    ROS Negative unless indicated in HPI.       Objective    BP 120/72   Pulse 71   Temp 98.1 F (36.7 C)   Ht 5' 7.5" (1.715 m)   Wt 144 lb 12.8 oz (65.7 kg)   LMP 11/01/2023 (Exact Date)   SpO2 98%   BMI 22.34 kg/m   Physical Exam Constitutional:      Appearance: Normal appearance.  HENT:     Mouth/Throat:     Mouth: Mucous membranes are moist.  Eyes:     Conjunctiva/sclera: Conjunctivae normal.     Pupils: Pupils are equal, round, and reactive to light.  Cardiovascular:     Rate and Rhythm: Normal rate and regular rhythm.     Pulses: Normal pulses.     Heart sounds: Normal heart sounds.  Pulmonary:     Effort: Pulmonary effort is normal.     Breath sounds: Normal breath sounds.  Abdominal:     General: Bowel sounds are normal.     Palpations: Abdomen is soft.  Musculoskeletal:        General: Tenderness present.     Cervical back: Normal range of motion. No tenderness.  Skin:    General: Skin is warm.     Findings: No bruising.  Neurological:     General: No focal deficit present.     Mental Status: She is alert and oriented to person, place, and time. Mental status is at baseline.  Psychiatric:        Mood and Affect: Mood normal.        Behavior: Behavior normal.        Thought Content: Thought content normal.        Judgment: Judgment normal.         Assessment & Plan:  Mild intermittent asthma, unspecified whether complicated Assessment & Plan: Triggered by allergies and exercise, well-managed with albuterol inhaler.   Rheumatoid factor positive Assessment & Plan: Diagnosed in 2021, symptoms worsen in winter, no recent flare-ups, interested in further evaluation. - Order blood work including rheumatoid factor. - Consider referral to rheumatologist.  Orders: -     Rheumatoid factor  Seasonal allergies  Establishing care with new doctor, encounter for -  CBC with Differential/Platelet -     Comprehensive metabolic panel with GFR -     Lipid panel -     TSH  Vitamin D  deficiency -     VITAMIN D 25 Hydroxy (Vit-D Deficiency, Fractures)  Encounter for screening for HIV -     HIV Antibody (routine testing w rflx)  Tail bone pain Assessment & Plan: Sustained injury from fall. - Advise heating pad for pain and swelling. - Consider x-ray if no improvement with heat.   Other orders -     HIV-1/2 AB - differentiation -     HIV 1 RNA, QL RT PCR    No follow-ups on file.   Kara Dies, NP

## 2023-11-25 NOTE — Patient Instructions (Signed)
 Brandi Clayton, a 33 year old female, visited to establish care after her previous provider moved away. She has a history of asthma, rheumatoid arthritis, a recent tailbone injury, GERD, seasonal allergies, and past depression and eating disorder, both in remission. We discussed her current health status and management plans for her conditions.  YOUR PLAN:  -TAILBONE INJURY: You have a recent tailbone injury from a fall, causing pain when standing up or bending over. We recommend using a heating pad to alleviate pain and swelling. If there is no improvement, we may consider an x-ray for further evaluation.  -RHEUMATOID ARTHRITIS: Rheumatoid arthritis is an autoimmune condition causing joint pain and inflammation, especially during winter. We will order blood work, including a rheumatoid factor test, and consider referring you to a rheumatologist for further evaluation.  -ASTHMA: Asthma is a condition where your airways narrow and swell, often triggered by allergies and exercise. Continue using your albuterol inhaler as needed, especially before running.  -GASTROESOPHAGEAL REFLUX DISEASE (GERD): GERD is a digestive disorder where stomach acid irritates the food pipe lining. You manage it with over-the-counter medications when symptoms are severe.  -SEASONAL ALLERGIES: Seasonal allergies cause symptoms like sneezing and runny nose, primarily in the spring. You can continue using over-the-counter antihistamines like Claritin as needed.  -DEPRESSION: Depression is a mood disorder that you've managed well with therapy. No medication is needed currently.  -EATING DISORDER IN REMISSION: Your eating disorder is in remission and managed with therapy. You previously took fluoxetine but do not need medication now.  -GENERAL HEALTH MAINTENANCE: You are up to date with your flu vaccine and maintain a healthy lifestyle. We will conduct blood work for vitamin D and HIV antigen levels and continue regular health  screenings and vaccinations.

## 2023-11-25 NOTE — Assessment & Plan Note (Signed)
 Seeing therapists from last 7 years,  Was on fluxoteine.

## 2023-11-25 NOTE — Assessment & Plan Note (Addendum)
 Triggered by allergies and exercise, well-managed with albuterol inhaler.

## 2023-11-26 ENCOUNTER — Encounter: Payer: Self-pay | Admitting: Nurse Practitioner

## 2023-12-02 DIAGNOSIS — F419 Anxiety disorder, unspecified: Secondary | ICD-10-CM | POA: Diagnosis not present

## 2023-12-02 LAB — HIV 1 RNA, QL RT PCR: HIV-1 RNA, Qualitative, TMA: NOT DETECTED

## 2023-12-02 LAB — HIV ANTIBODY (ROUTINE TESTING W REFLEX): HIV 1&2 Ab, 4th Generation: REACTIVE — AB

## 2023-12-02 LAB — HIV-1/2 AB - DIFFERENTIATION
HIV-1 antibody: NEGATIVE
HIV-2 Ab: NEGATIVE

## 2023-12-02 LAB — RHEUMATOID FACTOR: Rheumatoid fact SerPl-aCnc: 32 [IU]/mL — ABNORMAL HIGH (ref <14)

## 2023-12-08 DIAGNOSIS — M533 Sacrococcygeal disorders, not elsewhere classified: Secondary | ICD-10-CM | POA: Insufficient documentation

## 2023-12-08 NOTE — Assessment & Plan Note (Signed)
 Diagnosed in 2021, symptoms worsen in winter, no recent flare-ups, interested in further evaluation. - Order blood work including rheumatoid factor. - Consider referral to rheumatologist.

## 2023-12-08 NOTE — Assessment & Plan Note (Signed)
 Sustained injury from fall. - Advise heating pad for pain and swelling. - Consider x-ray if no improvement with heat.

## 2023-12-11 DIAGNOSIS — E785 Hyperlipidemia, unspecified: Secondary | ICD-10-CM | POA: Insufficient documentation

## 2023-12-11 NOTE — Assessment & Plan Note (Signed)
 Had elevated LDL Level in previous lab. -Ordered labs as outlined.

## 2023-12-17 DIAGNOSIS — B360 Pityriasis versicolor: Secondary | ICD-10-CM | POA: Diagnosis not present

## 2023-12-17 DIAGNOSIS — L700 Acne vulgaris: Secondary | ICD-10-CM | POA: Diagnosis not present

## 2023-12-23 DIAGNOSIS — F419 Anxiety disorder, unspecified: Secondary | ICD-10-CM | POA: Diagnosis not present

## 2023-12-25 ENCOUNTER — Ambulatory Visit: Payer: BC Managed Care – PPO | Admitting: Nurse Practitioner
